# Patient Record
Sex: Female | Born: 1985
Health system: Southern US, Community
[De-identification: ages and names within clinical notes are randomized; demographics above are authoritative.]

## PROBLEM LIST (undated history)

## (undated) DIAGNOSIS — Z789 Other specified health status: Secondary | ICD-10-CM

## (undated) DIAGNOSIS — G43909 Migraine, unspecified, not intractable, without status migrainosus: Secondary | ICD-10-CM

## (undated) DIAGNOSIS — K429 Umbilical hernia without obstruction or gangrene: Secondary | ICD-10-CM

## (undated) DIAGNOSIS — R519 Headache, unspecified: Secondary | ICD-10-CM

## (undated) DIAGNOSIS — E66813 Obesity, class 3: Secondary | ICD-10-CM

## (undated) DIAGNOSIS — D649 Anemia, unspecified: Secondary | ICD-10-CM

## (undated) DIAGNOSIS — Z6841 Body Mass Index (BMI) 40.0 and over, adult: Secondary | ICD-10-CM

## (undated) DIAGNOSIS — Z0000 Encounter for general adult medical examination without abnormal findings: Secondary | ICD-10-CM

---

## 2016-02-02 ENCOUNTER — Other Ambulatory Visit (HOSPITAL_COMMUNITY)
Admission: RE | Admit: 2016-02-02 | Discharge: 2016-02-02 | Disposition: A | Discharge status: Home / Self Care | Payer: BLUE CROSS/BLUE SHIELD | Source: Ambulatory Visit | Attending: Nurse Practitioner | Admitting: Nurse Practitioner

## 2016-02-02 DIAGNOSIS — Z1151 Encounter for screening for human papillomavirus (HPV): Secondary | ICD-10-CM | POA: Diagnosis present

## 2016-02-02 DIAGNOSIS — Z01419 Encounter for gynecological examination (general) (routine) without abnormal findings: Secondary | ICD-10-CM | POA: Diagnosis present

## 2016-05-13 LAB — OB RESULTS CONSOLE ABO/RH: RH Type: POSITIVE

## 2016-05-13 LAB — OB RESULTS CONSOLE RUBELLA ANTIBODY, IGM: Rubella: IMMUNE

## 2016-05-13 LAB — OB RESULTS CONSOLE GC/CHLAMYDIA
CHLAMYDIA, DNA PROBE: NEGATIVE
GC PROBE AMP, GENITAL: NEGATIVE

## 2016-05-13 LAB — OB RESULTS CONSOLE RPR: RPR: NONREACTIVE

## 2016-05-13 LAB — OB RESULTS CONSOLE ANTIBODY SCREEN: ANTIBODY SCREEN: NEGATIVE

## 2016-05-13 LAB — OB RESULTS CONSOLE HEPATITIS B SURFACE ANTIGEN: HEP B S AG: NEGATIVE

## 2016-05-13 LAB — OB RESULTS CONSOLE HIV ANTIBODY (ROUTINE TESTING): HIV: NONREACTIVE

## 2016-11-09 ENCOUNTER — Encounter (HOSPITAL_COMMUNITY): Payer: Self-pay | Admitting: *Deleted

## 2016-11-09 ENCOUNTER — Encounter (HOSPITAL_COMMUNITY): Payer: Self-pay

## 2016-11-09 ENCOUNTER — Telehealth (HOSPITAL_COMMUNITY): Payer: Self-pay | Admitting: *Deleted

## 2016-11-09 NOTE — Telephone Encounter (Signed)
Preadmission screen  

## 2016-11-10 ENCOUNTER — Other Ambulatory Visit (HOSPITAL_COMMUNITY): Payer: Self-pay | Admitting: Obstetrics & Gynecology

## 2016-11-14 ENCOUNTER — Ambulatory Visit (INDEPENDENT_AMBULATORY_CARE_PROVIDER_SITE_OTHER): Payer: BLUE CROSS/BLUE SHIELD | Admitting: *Deleted

## 2016-11-14 DIAGNOSIS — O09893 Supervision of other high risk pregnancies, third trimester: Secondary | ICD-10-CM | POA: Insufficient documentation

## 2016-11-14 DIAGNOSIS — O09213 Supervision of pregnancy with history of pre-term labor, third trimester: Secondary | ICD-10-CM

## 2016-11-14 DIAGNOSIS — Z98891 History of uterine scar from previous surgery: Secondary | ICD-10-CM

## 2016-11-14 MED ORDER — BETAMETHASONE SOD PHOS & ACET 6 (3-3) MG/ML IJ SUSP
6.0000 mg | Freq: Every day | INTRAMUSCULAR | Status: AC
  Administered 2016-11-14: 6 mg via INTRAMUSCULAR
  Administered 2016-11-15: 12 mg via INTRAMUSCULAR

## 2016-11-14 MED ORDER — BETAMETHASONE SOD PHOS & ACET 6 (3-3) MG/ML IJ SUSP: 6.0000 mg | Freq: Once | INTRAMUSCULAR | Status: DC

## 2016-11-14 NOTE — H&P (Signed)
HPI2 31 y/o G3P2002 @ 1943w5d estimated gestational age (as dated by LMP c/w 20 week ultrasound) presents for scheduled repeat C-section due to prior classical C-section.   no Leaking of Fluid,   no Vaginal Bleeding,   no Uterine Contractions,  + Fetal Movement.  ROS: no HA, no epigastric pain, no visual changes.    Pregnancy complicated by: 1) Prior classical C-section- old records obtained and confirmed 2) GBS pos 3) Gestational thrombocytopenia: last labs 11/30: plt 142   Prenatal Transfer Tool  Maternal Diabetes: No Genetic Screening: Normal Maternal Ultrasounds/Referrals: Normal Fetal Ultrasounds or other Referrals:  None Maternal Substance Abuse:  No Significant Maternal Medications:  None Significant Maternal Lab Results: Lab values include: Group B Strep positive   PNL:  GBS positive, Rub Immune, Hep B neg, RPR NR, HIV neg, GC/C neg, glucola:130 11/30: Hgb 12, Plt 142 Blood type: A positive, antibody neg  Immunizations: Tdap: 12/21 Flu: Rc'd as outpatient  OBHx: C-section x 2, prior vertical incision and classical C-section (9#6-9#13oz) PMHx:  none Meds:  PNV Allergy:  No Known Allergies SurgHx: C-section x 2 SocHx:   no Tobacco, no  EtOH, no Illicit Drugs  O: LMP 03/05/2016  (performed in office) Gen. AAOx3, NAD CV.  RRR  No murmur.  Resp. CTAB Abd. Gravid,  no tenderness,  no rigidity,  no guarding Extr.  no edema B/L , no calf tenderness  FHT:145 by doppler  Labs: see orders  A/P:  31 y.o. G3P2002 @ 6243w5d EGA who presents for scheduled early C-section due to prior Classical C-seciton -FWB:  Reassuring by doppler -NPO -LR @ 125cc/hr -SCDs to OR -Ancef 2g IV -Risk benefits and alternatives of cesarean section were discussed with the patient including but not limited to infection, bleeding, damage to bowel , bladder and baby with the need for further surgery. Pt voiced understanding and desires to proceed.   Myna HidalgoJennifer Junell Cullifer, DO [phone]  (pager) [phone] (office)

## 2016-11-15 ENCOUNTER — Ambulatory Visit (INDEPENDENT_AMBULATORY_CARE_PROVIDER_SITE_OTHER): Payer: BLUE CROSS/BLUE SHIELD | Admitting: *Deleted

## 2016-11-15 ENCOUNTER — Encounter: Payer: Self-pay | Admitting: *Deleted

## 2016-11-15 DIAGNOSIS — O09213 Supervision of pregnancy with history of pre-term labor, third trimester: Secondary | ICD-10-CM

## 2016-11-15 DIAGNOSIS — O09893 Supervision of other high risk pregnancies, third trimester: Secondary | ICD-10-CM

## 2016-11-15 NOTE — Progress Notes (Signed)
Pt arrived for 2nd dose of Betamethasone 12 mg IM - given and tolerated well.

## 2016-11-16 ENCOUNTER — Encounter (HOSPITAL_COMMUNITY)
Admission: RE | Admit: 2016-11-16 | Discharge: 2016-11-16 | Disposition: A | Discharge status: Home / Self Care | Payer: BLUE CROSS/BLUE SHIELD | Source: Ambulatory Visit | Attending: Obstetrics & Gynecology | Admitting: Obstetrics & Gynecology

## 2016-11-16 HISTORY — DX: Other specified health status: Z78.9

## 2016-11-16 LAB — CBC
HEMATOCRIT: 33.1 % — AB (ref 36.0–46.0)
HEMOGLOBIN: 11.5 g/dL — AB (ref 12.0–15.0)
MCH: 31.3 pg (ref 26.0–34.0)
MCHC: 34.7 g/dL (ref 30.0–36.0)
MCV: 90.2 fL (ref 78.0–100.0)
Platelets: 126 10*3/uL — ABNORMAL LOW (ref 150–400)
RBC: 3.67 MIL/uL — AB (ref 3.87–5.11)
RDW: 13.6 % (ref 11.5–15.5)
WBC: 8.3 10*3/uL (ref 4.0–10.5)

## 2016-11-16 LAB — ABO/RH: ABO/RH(D): A POS

## 2016-11-16 NOTE — Patient Instructions (Signed)
19 Leslie [name]  11/16/2016   Your procedure is scheduled on:  11/17/2016  [facility].  Pick up the phone at the desk and dial [phone].   Call this number if you have problems the morning of surgery: [phone]   Remember:   Do not eat food:After Midnight.  Do not drink clear liquids: After Midnight.  Take these medicines the morning of surgery with A SIP OF WATER: none   Do not wear jewelry, make-up or nail polish.  Do not wear lotions, powders, or perfumes. Do not wear deodorant.  Do not shave 48 hours prior to surgery.  Do not bring valuables to the hospital.  [facility] is not   responsible for any belongings or valuables brought to the hospital.  Contacts, dentures or bridgework may not be worn into surgery.  Leave suitcase in the car. After surgery it may be brought to your room.  For patients admitted to the hospital, checkout time is 11:00 AM the day of              discharge.   Patients discharged the day of surgery will not be allowed to drive             home.  Name and phone number of your driver: na  Special Instructions:   N/A   Please read over the following fact sheets that you were given:   Surgical Site Infection Prevention

## 2016-11-16 NOTE — Pre-Procedure Instructions (Signed)
Notified Dr Montel CulverKerrigan of platelet preop count no orders rec to repeat the lab in the morning.

## 2016-11-17 ENCOUNTER — Inpatient Hospital Stay (HOSPITAL_COMMUNITY): Payer: BLUE CROSS/BLUE SHIELD | Admitting: Certified Registered Nurse Anesthetist

## 2016-11-17 ENCOUNTER — Inpatient Hospital Stay (HOSPITAL_COMMUNITY)
Admission: RE | Admit: 2016-11-17 | Discharge: 2016-11-20 | DRG: 765 | Disposition: A | Discharge status: Home / Self Care | Payer: BLUE CROSS/BLUE SHIELD | Source: Ambulatory Visit | Attending: Obstetrics & Gynecology | Admitting: Obstetrics & Gynecology

## 2016-11-17 ENCOUNTER — Encounter (HOSPITAL_COMMUNITY)
Admission: RE | Disposition: A | Discharge status: Home / Self Care | Payer: Self-pay | Source: Ambulatory Visit | Attending: Obstetrics & Gynecology

## 2016-11-17 ENCOUNTER — Encounter (HOSPITAL_COMMUNITY): Payer: Self-pay

## 2016-11-17 DIAGNOSIS — O34212 Maternal care for vertical scar from previous cesarean delivery: Secondary | ICD-10-CM | POA: Diagnosis present

## 2016-11-17 DIAGNOSIS — Z3A36 36 weeks gestation of pregnancy: Secondary | ICD-10-CM

## 2016-11-17 DIAGNOSIS — O99824 Streptococcus B carrier state complicating childbirth: Secondary | ICD-10-CM | POA: Diagnosis not present

## 2016-11-17 DIAGNOSIS — D6959 Other secondary thrombocytopenia: Secondary | ICD-10-CM | POA: Diagnosis present

## 2016-11-17 DIAGNOSIS — Z302 Encounter for sterilization: Secondary | ICD-10-CM | POA: Diagnosis not present

## 2016-11-17 DIAGNOSIS — Z98891 History of uterine scar from previous surgery: Secondary | ICD-10-CM

## 2016-11-17 DIAGNOSIS — O99112 Other diseases of the blood and blood-forming organs and certain disorders involving the immune mechanism complicating pregnancy, second trimester: Secondary | ICD-10-CM | POA: Diagnosis not present

## 2016-11-17 DIAGNOSIS — O321XX0 Maternal care for breech presentation, not applicable or unspecified: Secondary | ICD-10-CM | POA: Diagnosis present

## 2016-11-17 DIAGNOSIS — D696 Thrombocytopenia, unspecified: Secondary | ICD-10-CM | POA: Diagnosis not present

## 2016-11-17 DIAGNOSIS — Z3493 Encounter for supervision of normal pregnancy, unspecified, third trimester: Secondary | ICD-10-CM

## 2016-11-17 DIAGNOSIS — O9912 Other diseases of the blood and blood-forming organs and certain disorders involving the immune mechanism complicating childbirth: Secondary | ICD-10-CM | POA: Diagnosis present

## 2016-11-17 DIAGNOSIS — O34219 Maternal care for unspecified type scar from previous cesarean delivery: Secondary | ICD-10-CM | POA: Diagnosis not present

## 2016-11-17 DIAGNOSIS — O09213 Supervision of pregnancy with history of pre-term labor, third trimester: Secondary | ICD-10-CM | POA: Diagnosis not present

## 2016-11-17 LAB — RPR: RPR: NONREACTIVE

## 2016-11-17 LAB — PREPARE RBC (CROSSMATCH)

## 2016-11-17 SURGERY — Surgical Case
Anesthesia: Spinal

## 2016-11-17 MED ORDER — ONDANSETRON HCL 4 MG/2ML IJ SOLN: 4.0000 mg | Freq: Four times a day (QID) | INTRAMUSCULAR | Status: DC | PRN

## 2016-11-17 MED ORDER — NALBUPHINE HCL 10 MG/ML IJ SOLN: 5.0000 mg | INTRAMUSCULAR | Status: DC | PRN

## 2016-11-17 MED ORDER — LACTATED RINGERS IV SOLN
INTRAVENOUS | Status: DC | PRN
  Administered 2016-11-17: 08:00:00 via INTRAVENOUS

## 2016-11-17 MED ORDER — SIMETHICONE 80 MG PO CHEW
80.0000 mg | CHEWABLE_TABLET | ORAL | Status: DC
  Administered 2016-11-17 – 2016-11-19 (×3): 80 mg via ORAL
  Filled 2016-11-17 (×3): qty 1

## 2016-11-17 MED ORDER — OXYTOCIN 10 UNIT/ML IJ SOLN
INTRAMUSCULAR | Status: AC
  Filled 2016-11-17: qty 4

## 2016-11-17 MED ORDER — NALOXONE HCL 0.4 MG/ML IJ SOLN: 0.4000 mg | INTRAMUSCULAR | Status: DC | PRN

## 2016-11-17 MED ORDER — IBUPROFEN 600 MG PO TABS
600.0000 mg | ORAL_TABLET | Freq: Four times a day (QID) | ORAL | Status: DC
  Administered 2016-11-17 – 2016-11-20 (×12): 600 mg via ORAL
  Filled 2016-11-17 (×12): qty 1

## 2016-11-17 MED ORDER — CEFAZOLIN SODIUM-DEXTROSE 2-4 GM/100ML-% IV SOLN
2.0000 g | INTRAVENOUS | Status: AC
  Administered 2016-11-17: 2 g via INTRAVENOUS
  Filled 2016-11-17: qty 100

## 2016-11-17 MED ORDER — KETOROLAC TROMETHAMINE 30 MG/ML IJ SOLN: 30.0000 mg | Freq: Four times a day (QID) | INTRAMUSCULAR | Status: DC | PRN

## 2016-11-17 MED ORDER — PHENYLEPHRINE 8 MG IN D5W 100 ML (0.08MG/ML) PREMIX OPTIME
INJECTION | INTRAVENOUS | Status: DC | PRN
  Administered 2016-11-17: 60 ug/min via INTRAVENOUS

## 2016-11-17 MED ORDER — PROMETHAZINE HCL 25 MG/ML IJ SOLN
INTRAMUSCULAR | Status: AC
  Filled 2016-11-17: qty 1

## 2016-11-17 MED ORDER — PHENYLEPHRINE 8 MG IN D5W 100 ML (0.08MG/ML) PREMIX OPTIME
INJECTION | INTRAVENOUS | Status: AC
  Filled 2016-11-17: qty 100

## 2016-11-17 MED ORDER — BUPIVACAINE IN DEXTROSE 0.75-8.25 % IT SOLN
INTRATHECAL | Status: DC | PRN
  Administered 2016-11-17: 1.6 mL via INTRATHECAL

## 2016-11-17 MED ORDER — FENTANYL CITRATE (PF) 100 MCG/2ML IJ SOLN
INTRAMUSCULAR | Status: AC
  Filled 2016-11-17: qty 2

## 2016-11-17 MED ORDER — DEXAMETHASONE SODIUM PHOSPHATE 10 MG/ML IJ SOLN
INTRAMUSCULAR | Status: DC | PRN
  Administered 2016-11-17: 10 mg via INTRAVENOUS

## 2016-11-17 MED ORDER — WITCH HAZEL-GLYCERIN EX PADS: 1.0000 "application " | MEDICATED_PAD | CUTANEOUS | Status: DC | PRN

## 2016-11-17 MED ORDER — FENTANYL CITRATE (PF) 100 MCG/2ML IJ SOLN
INTRAMUSCULAR | Status: DC | PRN
  Administered 2016-11-17: 10 ug via INTRATHECAL

## 2016-11-17 MED ORDER — MENTHOL 3 MG MT LOZG: 1.0000 | LOZENGE | OROMUCOSAL | Status: DC | PRN

## 2016-11-17 MED ORDER — SENNOSIDES-DOCUSATE SODIUM 8.6-50 MG PO TABS
2.0000 | ORAL_TABLET | ORAL | Status: DC
  Administered 2016-11-17 – 2016-11-19 (×3): 2 via ORAL
  Filled 2016-11-17 (×3): qty 2

## 2016-11-17 MED ORDER — DIPHENHYDRAMINE HCL 50 MG/ML IJ SOLN: 12.5000 mg | INTRAMUSCULAR | Status: DC | PRN

## 2016-11-17 MED ORDER — SCOPOLAMINE 1 MG/3DAYS TD PT72
MEDICATED_PATCH | TRANSDERMAL | Status: AC
  Filled 2016-11-17: qty 1

## 2016-11-17 MED ORDER — NALOXONE HCL 2 MG/2ML IJ SOSY
1.0000 ug/kg/h | PREFILLED_SYRINGE | INTRAVENOUS | Status: DC | PRN
  Filled 2016-11-17: qty 2

## 2016-11-17 MED ORDER — DIPHENHYDRAMINE HCL 25 MG PO CAPS
25.0000 mg | ORAL_CAPSULE | ORAL | Status: DC | PRN
  Filled 2016-11-17: qty 1

## 2016-11-17 MED ORDER — NALBUPHINE HCL 10 MG/ML IJ SOLN: 5.0000 mg | Freq: Once | INTRAMUSCULAR | Status: DC | PRN

## 2016-11-17 MED ORDER — PRENATAL MULTIVITAMIN CH
1.0000 | ORAL_TABLET | Freq: Every day | ORAL | Status: DC
  Administered 2016-11-17 – 2016-11-19 (×3): 1 via ORAL
  Filled 2016-11-17 (×3): qty 1

## 2016-11-17 MED ORDER — MEPERIDINE HCL 25 MG/ML IJ SOLN: 6.2500 mg | INTRAMUSCULAR | Status: DC | PRN

## 2016-11-17 MED ORDER — SCOPOLAMINE 1 MG/3DAYS TD PT72
MEDICATED_PATCH | TRANSDERMAL | Status: DC | PRN
  Administered 2016-11-17: 1 via TRANSDERMAL

## 2016-11-17 MED ORDER — ONDANSETRON HCL 4 MG/2ML IJ SOLN: 4.0000 mg | Freq: Three times a day (TID) | INTRAMUSCULAR | Status: DC | PRN

## 2016-11-17 MED ORDER — OXYCODONE HCL 5 MG PO TABS: 5.0000 mg | ORAL_TABLET | Freq: Once | ORAL | Status: DC | PRN

## 2016-11-17 MED ORDER — PROMETHAZINE HCL 25 MG/ML IJ SOLN
12.5000 mg | Freq: Once | INTRAMUSCULAR | Status: AC
  Administered 2016-11-17: 12.5 mg via INTRAVENOUS

## 2016-11-17 MED ORDER — SCOPOLAMINE 1 MG/3DAYS TD PT72: 1.0000 | MEDICATED_PATCH | Freq: Once | TRANSDERMAL | Status: DC

## 2016-11-17 MED ORDER — COCONUT OIL OIL: 1.0000 "application " | TOPICAL_OIL | Status: DC | PRN

## 2016-11-17 MED ORDER — DIBUCAINE 1 % RE OINT: 1.0000 "application " | TOPICAL_OINTMENT | RECTAL | Status: DC | PRN

## 2016-11-17 MED ORDER — METOCLOPRAMIDE HCL 5 MG/ML IJ SOLN
INTRAMUSCULAR | Status: AC
  Filled 2016-11-17: qty 2

## 2016-11-17 MED ORDER — OXYTOCIN 40 UNITS IN LACTATED RINGERS INFUSION - SIMPLE MED: 2.5000 [IU]/h | INTRAVENOUS | Status: AC

## 2016-11-17 MED ORDER — LACTATED RINGERS IV SOLN
INTRAVENOUS | Status: DC
  Administered 2016-11-17: 11:00:00 via INTRAVENOUS

## 2016-11-17 MED ORDER — MORPHINE SULFATE (PF) 0.5 MG/ML IJ SOLN
INTRAMUSCULAR | Status: DC | PRN
  Administered 2016-11-17: .2 mg via INTRATHECAL

## 2016-11-17 MED ORDER — SODIUM CHLORIDE 0.9 % IR SOLN
Status: DC | PRN
  Administered 2016-11-17: 1

## 2016-11-17 MED ORDER — ZOLPIDEM TARTRATE 5 MG PO TABS: 5.0000 mg | ORAL_TABLET | Freq: Every evening | ORAL | Status: DC | PRN

## 2016-11-17 MED ORDER — ACETAMINOPHEN 325 MG PO TABS: 650.0000 mg | ORAL_TABLET | ORAL | Status: DC | PRN

## 2016-11-17 MED ORDER — MORPHINE SULFATE (PF) 0.5 MG/ML IJ SOLN
INTRAMUSCULAR | Status: AC
  Filled 2016-11-17: qty 10

## 2016-11-17 MED ORDER — SODIUM CHLORIDE 0.9% FLUSH: 3.0000 mL | INTRAVENOUS | Status: DC | PRN

## 2016-11-17 MED ORDER — OXYTOCIN 10 UNIT/ML IJ SOLN
INTRAVENOUS | Status: DC | PRN
  Administered 2016-11-17: 40 [IU] via INTRAVENOUS

## 2016-11-17 MED ORDER — LACTATED RINGERS IV SOLN
INTRAVENOUS | Status: DC
  Administered 2016-11-17 (×2): via INTRAVENOUS

## 2016-11-17 MED ORDER — METOCLOPRAMIDE HCL 5 MG/ML IJ SOLN
INTRAMUSCULAR | Status: DC | PRN
  Administered 2016-11-17: 10 mg via INTRAVENOUS

## 2016-11-17 MED ORDER — DIPHENHYDRAMINE HCL 25 MG PO CAPS: 25.0000 mg | ORAL_CAPSULE | Freq: Four times a day (QID) | ORAL | Status: DC | PRN

## 2016-11-17 MED ORDER — OXYCODONE HCL 5 MG PO TABS
5.0000 mg | ORAL_TABLET | ORAL | Status: DC | PRN
  Administered 2016-11-18: 5 mg via ORAL
  Filled 2016-11-17: qty 1

## 2016-11-17 MED ORDER — OXYCODONE HCL 5 MG/5ML PO SOLN: 5.0000 mg | Freq: Once | ORAL | Status: DC | PRN

## 2016-11-17 MED ORDER — SIMETHICONE 80 MG PO CHEW: 80.0000 mg | CHEWABLE_TABLET | ORAL | Status: DC | PRN

## 2016-11-17 MED ORDER — DEXAMETHASONE SODIUM PHOSPHATE 10 MG/ML IJ SOLN
INTRAMUSCULAR | Status: AC
  Filled 2016-11-17: qty 1

## 2016-11-17 MED ORDER — FENTANYL CITRATE (PF) 100 MCG/2ML IJ SOLN: 25.0000 ug | INTRAMUSCULAR | Status: DC | PRN

## 2016-11-17 MED ORDER — ONDANSETRON HCL 4 MG/2ML IJ SOLN
INTRAMUSCULAR | Status: DC | PRN
  Administered 2016-11-17: 4 mg via INTRAVENOUS

## 2016-11-17 MED ORDER — SIMETHICONE 80 MG PO CHEW
80.0000 mg | CHEWABLE_TABLET | Freq: Three times a day (TID) | ORAL | Status: DC
  Administered 2016-11-17 – 2016-11-19 (×7): 80 mg via ORAL
  Filled 2016-11-17 (×7): qty 1

## 2016-11-17 MED ORDER — ONDANSETRON HCL 4 MG/2ML IJ SOLN
INTRAMUSCULAR | Status: AC
  Filled 2016-11-17: qty 2

## 2016-11-17 SURGICAL SUPPLY — 39 items
BENZOIN TINCTURE PRP APPL 2/3 (GAUZE/BANDAGES/DRESSINGS) ×3 IMPLANT
CLAMP CORD UMBIL (MISCELLANEOUS) IMPLANT
CLOSURE STERI STRIP 1/2 X4 (GAUZE/BANDAGES/DRESSINGS) ×2 IMPLANT
CLOSURE WOUND 1/2 X4 (GAUZE/BANDAGES/DRESSINGS) ×1
CLOTH BEACON ORANGE TIMEOUT ST (SAFETY) ×3 IMPLANT
DERMABOND ADVANCED (GAUZE/BANDAGES/DRESSINGS)
DERMABOND ADVANCED .7 DNX12 (GAUZE/BANDAGES/DRESSINGS) IMPLANT
DRSG OPSITE POSTOP 4X10 (GAUZE/BANDAGES/DRESSINGS) ×3 IMPLANT
DURAPREP 26ML APPLICATOR (WOUND CARE) ×3 IMPLANT
ELECT REM PT RETURN 9FT ADLT (ELECTROSURGICAL) ×3
ELECTRODE REM PT RTRN 9FT ADLT (ELECTROSURGICAL) ×1 IMPLANT
EXTRACTOR VACUUM KIWI (MISCELLANEOUS) IMPLANT
GLOVE BIOGEL PI IND STRL 6.5 (GLOVE) ×1 IMPLANT
GLOVE BIOGEL PI IND STRL 7.0 (GLOVE) ×1 IMPLANT
GLOVE BIOGEL PI INDICATOR 6.5 (GLOVE) ×2
GLOVE BIOGEL PI INDICATOR 7.0 (GLOVE) ×2
GLOVE ECLIPSE 6.5 STRL STRAW (GLOVE) ×3 IMPLANT
GOWN STRL REUS W/TWL LRG LVL3 (GOWN DISPOSABLE) ×6 IMPLANT
HEMOSTAT SURGICEL 4X8 (HEMOSTASIS) ×3 IMPLANT
KIT ABG SYR 3ML LUER SLIP (SYRINGE) ×3 IMPLANT
NEEDLE HYPO 25X5/8 SAFETYGLIDE (NEEDLE) ×3 IMPLANT
NS IRRIG 1000ML POUR BTL (IV SOLUTION) ×3 IMPLANT
PACK C SECTION WH (CUSTOM PROCEDURE TRAY) ×3 IMPLANT
PAD ABD 7.5X8 STRL (GAUZE/BANDAGES/DRESSINGS) ×3 IMPLANT
PAD OB MATERNITY 4.3X12.25 (PERSONAL CARE ITEMS) ×3 IMPLANT
PENCIL SMOKE EVAC W/HOLSTER (ELECTROSURGICAL) ×3 IMPLANT
RTRCTR C-SECT PINK 25CM LRG (MISCELLANEOUS) ×3 IMPLANT
STRIP CLOSURE SKIN 1/2X4 (GAUZE/BANDAGES/DRESSINGS) ×2 IMPLANT
SUT PLAIN 0 NONE (SUTURE) ×6 IMPLANT
SUT PLAIN 2 0 XLH (SUTURE) IMPLANT
SUT VIC AB 0 CT1 27 (SUTURE) ×4
SUT VIC AB 0 CT1 27XBRD ANBCTR (SUTURE) ×2 IMPLANT
SUT VIC AB 0 CTX 36 (SUTURE) ×6
SUT VIC AB 0 CTX36XBRD ANBCTRL (SUTURE) ×3 IMPLANT
SUT VIC AB 2-0 CT1 27 (SUTURE) ×2
SUT VIC AB 2-0 CT1 TAPERPNT 27 (SUTURE) ×1 IMPLANT
SUT VIC AB 4-0 KS 27 (SUTURE) ×3 IMPLANT
TOWEL OR 17X24 6PK STRL BLUE (TOWEL DISPOSABLE) ×3 IMPLANT
TRAY FOLEY CATH SILVER 14FR (SET/KITS/TRAYS/PACK) IMPLANT

## 2016-11-17 NOTE — Anesthesia Preprocedure Evaluation (Signed)
Anesthesia Evaluation  Patient identified by MRN, date of birth, ID band Patient awake    Reviewed: Allergy & Precautions, H&P , NPO status , Patient's Chart, lab work & pertinent test results  Airway Mallampati: II   Neck ROM: full    Dental   Pulmonary neg pulmonary ROS,    breath sounds clear to auscultation       Cardiovascular negative cardio ROS   Rhythm:regular Rate:Normal     Neuro/Psych    GI/Hepatic   Endo/Other  obese  Renal/GU      Musculoskeletal   Abdominal   Peds  Hematology   Anesthesia Other Findings   Reproductive/Obstetrics IUP.                             Anesthesia Physical Anesthesia Plan  ASA: II  Anesthesia Plan: Spinal   Post-op Pain Management:    Induction: Intravenous  Airway Management Planned: Simple Face Mask  Additional Equipment:   Intra-op Plan:   Post-operative Plan:   Informed Consent: I have reviewed the patients History and Physical, chart, labs and discussed the procedure including the risks, benefits and alternatives for the proposed anesthesia with the patient or authorized representative who has indicated his/her understanding and acceptance.     Plan Discussed with: CRNA, Anesthesiologist and Surgeon  Anesthesia Plan Comments:         Anesthesia Quick Evaluation

## 2016-11-17 NOTE — Anesthesia Procedure Notes (Signed)
Spinal  Patient location during procedure: OR Start time: 11/17/2016 7:29 AM End time: 11/17/2016 7:39 AM Staffing Anesthesiologist: Heather RobertsSINGER, Arlayne Liggins Performed: anesthesiologist  Preanesthetic Checklist Completed: patient identified, surgical consent, pre-op evaluation, timeout performed, IV checked, risks and benefits discussed and monitors and equipment checked Spinal Block Patient position: sitting Prep: DuraPrep Patient monitoring: cardiac monitor, continuous pulse ox and blood pressure Approach: midline Location: L2-3 Injection technique: single-shot Needle Needle type: Pencan  Needle gauge: 24 G Needle length: 9 cm Additional Notes Functioning IV was confirmed and monitors were applied. Sterile prep and drape, including hand hygiene and sterile gloves were used. The patient was positioned and the spine was prepped. The skin was anesthetized with lidocaine.  Free flow of clear CSF was obtained prior to injecting local anesthetic into the CSF.  The spinal needle aspirated freely following injection.  The needle was carefully withdrawn.  The patient tolerated the procedure well.

## 2016-11-17 NOTE — Lactation Note (Signed)
This note was copied from a baby's chart. Lactation Consultation Note  Patient Name: Leslie [name] [id number]'s Date: 11/17/2016 Reason for consult: Initial assessment;NICU baby;Late preterm infant Breastfeeding consultation services and Providing Breastmilk for Your Baby in NICU booklet given and reviewed.  Baby is 36.5 weeks and in NICU.  Mom has initiated pumping and obtained 30 mls.  Reviewed importance of pumping 8-12 times in 24 hours followed by hand expression.  Discussed late preterm feeding norm.  Mom states she has a DEBP at home.  Encouraged to call with questions/concerns.  Maternal Data Has patient been taught Hand Expression?: Yes Does the patient have breastfeeding experience prior to this delivery?: Yes  Feeding    LATCH Score/Interventions                      Lactation Tools Discussed/Used Pump Review: Setup, frequency, and cleaning;Milk Storage Initiated by:: RN Date initiated:: 11/17/16   Consult Status Consult Status: Follow-up Date: 11/18/16 Follow-up type: In-patient    Huston FoleyMOULDEN, Wenzel Backlund S 11/17/2016, 3:47 PM

## 2016-11-17 NOTE — Op Note (Signed)
PreOp Diagnosis:  1) Intrauterine pregnancy @ 347w5d  2) Prior Classical C-section 3) Desire for permanent sterilization  PostOp Diagnosis: same Procedure: Repeat low transverse C-section and bilateral salpingectomy Surgeon: Dr. Myna HidalgoJennifer Loretta [name] Assistant: Dr. Marylou FlesherBenita Varnado Anesthesia: spinal Complications: none EBL: 600cc UOP: 2000cc Fluids: 150cc  Findings: Female infant from breech presentation, normal tubes and ovaries bilaterally.  PROCEDURE:  Informed consent was obtained from the patient with risks, benefits, complications, treatment options, and expected outcomes discussed with the patient.  The patient concurred with the proposed plan, giving informed consent with form signed.   The patient was taken to Operating Room, and identified with the procedure verified as C-Section Delivery with Time Out. Traxi was used for traction.  With induction of anesthesia, the patient was prepped and draped in the usual sterile fashion. A Pfannenstiel incision was made and carried down through the subcutaneous tissue to the fascia. The fascia was incised in the midline and extended transversely. The superior aspect of the fascial incision was grasped with Kochers elevated and the underlying muscle dissected off. The inferior aspect of the facial incision was in similar fashion, grasped elevated and rectus muscles dissected off. Due to prior scar tissue, the peritoneum was entered with dissection of the fascia.  The utero-vesical peritoneal reflection was identified and due to prior scar tissue and thin uterus, bladder flap was not created.  A low transverse uterine incision was made and the infant's sacrum delivered atraumatically. The legs were flexed and delivered.  The arms were rotated and delivered followed by flexion of the head with delivery.  After the umbilical cord was clamped and cut cord blood was obtained for evaluation.   The placenta was removed intact and appeared normal. The uterine  outline, tubes and ovaries appeared normal. The uterine incision was closed with running locked sutures of 0 Vicryl and a second layer of the same stitch was used in an imbricating fashion.  Attention was turned to the left fallopian tube, fimbraie were identified. Making an avascular window, serial ligation of the tube was performed using plain gut for removal of the lower 2/3 of the fallopian tube.  A similar procedure was carried out on the right.  Excellent hemostasis was obtained.  The pericolic gutters were cleared of all clots and debris.  Surgicel was placed over the hysterotomy.  The fascia was then reapproximated with running sutures of 0 Vicryl. The subcutaneous tissue was reapproximated with 2-0 plain gut suture.   The skin was closed with 4-0 vicryl in a subcuticular fashion.  Instrument, sponge, and needle counts were correct prior the abdominal closure and at the conclusion of the case. The patient was taken to recovery in stable condition.  Myna HidalgoJennifer Zhion Pevehouse, DO [phone] (pager) [phone] (office)

## 2016-11-17 NOTE — Interval H&P Note (Signed)
History and Physical Interval Note:  11/17/2016 6:33 AM  Leslie [name]  has presented today for surgery, with the diagnosis of Z98.891 History of C-Section  The various methods of treatment have been discussed with the patient and family. After consideration of risks, benefits and other options for treatment, the patient has consented to  Procedure(s): CESAREAN SECTION WITH BILATERAL TUBAL LIGATION (N/A) possible bilateral salpingectomy as a surgical intervention .  The patient's history has been reviewed, patient examined, no change in status, stable for surgery.  I have reviewed the patient's chart and labs.  Questions were answered to the patient's satisfaction.     Myna HidalgoZAN, Vardaan Depascale, M

## 2016-11-17 NOTE — Anesthesia Postprocedure Evaluation (Addendum)
Anesthesia Post Note  Patient: Leslie [name]  Procedure(s) Performed: Procedure(s) (LRB): CESAREAN SECTION WITH BILATERAL TUBAL LIGATION (N/A)  Patient location during evaluation: Mother Baby Anesthesia Type: Spinal Level of consciousness: awake and alert and oriented Pain management: satisfactory to patient Vital Signs Assessment: post-procedure vital signs reviewed and stable Respiratory status: spontaneous breathing and nonlabored ventilation Cardiovascular status: stable Postop Assessment: no headache, no backache, patient able to bend at knees, no signs of nausea or vomiting and adequate PO intake Anesthetic complications: no        Last Vitals:  Vitals:   11/17/16 1255 11/17/16 1400  BP: 121/66 132/67  Pulse: 64 79  Resp:    Temp: 36.7 C 36.9 C    Last Pain:  Vitals:   11/17/16 1600  TempSrc:   PainSc: 0-No pain   Pain Goal:                 GREGORY,SUZANNE

## 2016-11-17 NOTE — Addendum Note (Signed)
Addendum  created 11/17/16 1624 by Shanon PayorSuzanne M Daishaun Ayre, CRNA   Sign clinical note

## 2016-11-17 NOTE — Progress Notes (Signed)
Pt OOB and ambulating without difficulty. Foley d/c'd per order. Patient denies having any pain. OB assessment WNL with scant amount of bleeding. Carmelina DaneERRI L Tahnee Cifuentes, RN

## 2016-11-17 NOTE — Transfer of Care (Signed)
Immediate Anesthesia Transfer of Care Note  Patient: Leslie [name]  Procedure(s) Performed: Procedure(s): CESAREAN SECTION WITH BILATERAL TUBAL LIGATION (N/A)  Patient Location: PACU  Anesthesia Type:Spinal  Level of Consciousness: awake, alert , oriented and patient cooperative  Airway & Oxygen Therapy: Patient Spontanous Breathing  Post-op Assessment: Report given to RN and Post -op Vital signs reviewed and stable  Post vital signs: Reviewed and stable  Last Vitals:  Vitals:   11/17/16 0605  BP: 120/75  Pulse: 68  Resp: 18  Temp: 37 C    Last Pain:  Vitals:   11/17/16 0605  TempSrc: Oral         Complications: No apparent anesthesia complications

## 2016-11-17 NOTE — Anesthesia Postprocedure Evaluation (Signed)
Anesthesia Post Note  Patient: Leslie Sierrasatreka [name]  Procedure(s) Performed: Procedure(s) (LRB): CESAREAN SECTION WITH BILATERAL TUBAL LIGATION (N/A)  Patient location during evaluation: PACU Anesthesia Type: Spinal Level of consciousness: awake and alert Pain management: pain level controlled Vital Signs Assessment: post-procedure vital signs reviewed and stable Respiratory status: spontaneous breathing and respiratory function stable Cardiovascular status: blood pressure returned to baseline and stable Postop Assessment: spinal receding Anesthetic complications: no        Last Vitals:  Vitals:   11/17/16 0914 11/17/16 0915  BP:  120/87  Pulse: 73 75  Resp: (!) 32 (!) 26  Temp:      Last Pain:  Vitals:   11/17/16 0605  TempSrc: Oral   Pain Goal:                 [name]

## 2016-11-18 ENCOUNTER — Encounter (HOSPITAL_COMMUNITY): Payer: Self-pay | Admitting: *Deleted

## 2016-11-18 DIAGNOSIS — Z98891 History of uterine scar from previous surgery: Secondary | ICD-10-CM

## 2016-11-18 LAB — CBC
HCT: 32.7 % — ABNORMAL LOW (ref 36.0–46.0)
Hemoglobin: 11.3 g/dL — ABNORMAL LOW (ref 12.0–15.0)
MCH: 30.8 pg (ref 26.0–34.0)
MCHC: 34.6 g/dL (ref 30.0–36.0)
MCV: 89.1 fL (ref 78.0–100.0)
Platelets: 139 10*3/uL — ABNORMAL LOW (ref 150–400)
RBC: 3.67 MIL/uL — ABNORMAL LOW (ref 3.87–5.11)
RDW: 13.7 % (ref 11.5–15.5)
WBC: 10.4 10*3/uL (ref 4.0–10.5)

## 2016-11-18 MED ORDER — DOCUSATE SODIUM 100 MG PO CAPS: 100.0000 mg | ORAL_CAPSULE | Freq: Two times a day (BID) | ORAL | 0 refills | Status: DC

## 2016-11-18 MED ORDER — IBUPROFEN 600 MG PO TABS: 600.0000 mg | ORAL_TABLET | Freq: Four times a day (QID) | ORAL | 0 refills | Status: DC

## 2016-11-18 NOTE — Lactation Note (Signed)
This note was copied from a baby's chart. Lactation Consultation Note  Patient Name: Leslie [name] [id number] Date: 11/18/2016 Reason for consult: Follow-up assessment;NICU baby  Baby 2 hours old. Mom reports that she is getting lots of milk when she pumps. Enc mom to continue pumping every 2-3 hours for a total of at least 8 times/24 hours--followed by hand expression. Enc mom to call insurance company about getting a DEBP, and mom stated that she had already purchased a personal pump. Discussed the benefits of a hospital-grade pump, and mom aware of 2-week rental. Discussed pumping rooms in NICU, and enc taking kit home including paddles.   Maternal Data    Feeding    LATCH Score/Interventions                      Lactation Tools Discussed/Used     Consult Status Consult Status: Follow-up Date: 11/19/16 Follow-up type: In-patient    Andres Labrum 11/18/2016, 11:18 AM

## 2016-11-18 NOTE — Discharge Instructions (Signed)
Cesarean Delivery, Care After Refer to this sheet in the next few weeks. These instructions provide you with information about caring for yourself after your procedure. Your health care provider may also give you more specific instructions. Your treatment has been planned according to current medical practices, but problems sometimes occur. Call your health care provider if you have any problems or questions after your procedure. What can I expect after the procedure? After the procedure, it is common to have:  A small amount of blood or clear fluid coming from the incision.  Some redness, swelling, and pain in your incision area.  Some abdominal pain and soreness.  Vaginal bleeding (lochia).  Pelvic cramps.  Fatigue. Follow these instructions at home: Incision care  Follow instructions from your health care provider about how to take care of your incision. Make sure you:  Wash your hands with soap and water before you change your bandage (dressing). If soap and water are not available, use hand sanitizer.  Change your dressing as told by your health care provider.  Leave stitches (sutures), skin staples, skin glue, or adhesive strips in place. These skin closures may need to stay in place for 2 weeks or longer. If adhesive strip edges start to loosen and curl up, you may trim the loose edges. Do not remove adhesive strips completely unless your health care provider tells you to do that.  Check your incision area every day for signs of infection. Check for:  More redness, swelling, or pain.  More fluid or blood.  Warmth.  Pus or a bad smell.  When you cough or sneeze, hug a pillow. This helps with pain and decreases the chance of your incision opening up (dehiscing). Do this until your incision heals. Medicines  Take over-the-counter and prescription medicines only as told by your health care provider.  For pain management you may alternate between percocet and motrin for pain  management.  Percocet may cause constipation, so please take Colace- stool softener twice daily.  If you were prescribed an antibiotic medicine, take it as told by your health care provider. Do not stop taking the antibiotic until it is finished. Driving  Do not drive or operate heavy machinery while taking prescription pain medicine.  Do not drive for 24 hours if you received a sedative. Lifestyle  Do not drink alcohol. This is especially important if you are breastfeeding or taking pain medicine.  Do not use tobacco products, including cigarettes, chewing tobacco, or e-cigarettes. If you need help quitting, ask your health care provider. Tobacco can delay wound healing. Eating and drinking  Drink at least 8 eight-ounce glasses of water every day unless told not to by your health care provider. If you breastfeed, you may need to drink more water than this.  Eat high-fiber foods every day. These foods may help prevent or relieve constipation. High-fiber foods include:  Whole grain cereals and breads.  Brown rice.  Beans.  Fresh fruits and vegetables. Activity  Return to your normal activities as told by your health care provider. Ask your health care provider what activities are safe for you.  Rest as much as possible. Try to rest or take a nap while your baby is sleeping.  Do not lift anything that is heavier than your baby or 10 lb (4.5 kg) as told by your health care provider.  Ask your health care provider when you can engage in sexual activity. This may depend on your:  Risk of infection.  Healing rate.  Comfort and desire to engage in sexual activity. Bathing  Do not take baths, swim, or use a hot tub until your health care provider approves. Ask your health care provider if you can take showers. You may only be allowed to take sponge baths until your incision heals.  Keep your dressing dry as told by your health care provider. General instructions  Do not use  tampons or douches until your health care provider approves.  Wear:  Loose, comfortable clothing.  A supportive and well-fitting bra.  Watch for any blood clots that may pass from your vagina. These may look like clumps of dark red, brown, or black discharge.  Keep your perineum clean and dry as told by your health care provider.  Wipe from front to back when you use the toilet.  If possible, have someone help you care for your baby and help with household activities for a few days after you leave the hospital.  Keep all follow-up visits for you and your baby as told by your health care provider. This is important. Contact a health care provider if:  You have:  Bad-smelling vaginal discharge.  Difficulty urinating.  Pain when urinating.  A sudden increase or decrease in the frequency of your bowel movements.  More redness, swelling, or pain around your incision.  More fluid or blood coming from your incision.  Pus or a bad smell coming from your incision.  A fever.  A rash.  Little or no interest in activities you used to enjoy.  Questions about caring for yourself or your baby.  Nausea.  Your incision feels warm to the touch.  Your breasts turn red or become painful or hard.  You feel unusually sad or worried.  You vomit.  You pass large blood clots from your vagina. If you pass a blood clot, save it to show to your health care provider. Do not flush blood clots down the toilet without showing your health care provider.  You urinate more than usual.  You are dizzy or light-headed.  You have not breastfed and have not had a menstrual period for 12 weeks after delivery.  You stopped breastfeeding and have not had a menstrual period for 12 weeks after stopping breastfeeding. Get help right away if:  You have:  Pain that does not go away or get better with medicine.  Chest pain.  Difficulty breathing.  Blurred vision or spots in your  vision.  Thoughts about hurting yourself or your baby.  New pain in your abdomen or in one of your legs.  A severe headache.  You faint.  You bleed from your vagina so much that you fill two sanitary pads in one hour. This information is not intended to replace advice given to you by your health care provider. Make sure you discuss any questions you have with your health care provider. Document Released: 06/25/2002 Document Revised: 02/11/2016 Document Reviewed: 09/07/2015 Elsevier Interactive Patient Education  2017 [employer]

## 2016-11-18 NOTE — Addendum Note (Signed)
Addendum  created 11/18/16 [id number] by Algis GreenhouseLinda A Akim Watkinson, CRNA   Charge Capture section accepted

## 2016-11-18 NOTE — Progress Notes (Signed)
Postoperative Note Day # 1  S:  Patient resting comfortable in bed.  Pain controlled.  Tolerating general diet. + flatus, no BM.  Lochia moderate.  Ambulating and voiding without difficulty.  She denies n/v/f/c, SOB, or CP.  Baby in NICU, mom is pumping.  O: Temp:  [97.7 F (36.5 C)-98.4 F (36.9 C)] 98.2 F (36.8 C) (02/02 0453) Pulse Rate:  [59-79] 63 (02/02 0453) Resp:  [8-32] 18 (02/02 0453) BP: (115-132)/(66-87) 115/67 (02/02 0453) SpO2:  [96 %-100 %] 99 % (02/02 0453) Weight:  [230 lb (104.3 kg)] 230 lb (104.3 kg) (02/01 1050)   Gen: A&Ox3, NAD CV: RRR Resp: CTAB Abdomen: soft, NT, ND +BS Uterus: firm, non-tender, below umbilicus Incision: c/d/i, bandage on Ext: No edema, no calf tenderness bilaterally, SCDs in place  Labs:  Recent Labs  11/16/16 0930 11/18/16 0552  HGB 11.5* 11.3*    A/P: Pt is a 31 y.o. [id number] s/p repeat C-section, POD#1  - Pain well controlled -GU: UOP is adequate -GI: Tolerating general diet -Activity: encouraged sitting up to chair and ambulation as tolerated -Prophylaxis: early ambulation, SCDs while in bed -Labs: stable as above  Continue with routine postpartum care  Myna HidalgoJennifer Valdez Brannan, DO [phone] (pager) [phone] (office)

## 2016-11-19 MED ORDER — OXYCODONE HCL 5 MG PO TABS: 5.0000 mg | ORAL_TABLET | Freq: Four times a day (QID) | ORAL | 0 refills | Status: DC | PRN

## 2016-11-19 NOTE — Lactation Note (Signed)
This note was copied from a baby's chart. Lactation Consultation Note  Patient Name: Leslie [name]: 11/19/2016 Reason for consult: Follow-up assessment;NICU baby Infant is 7553 hours old & [facility]. Mom had just finished pumping for ~20 mins when LC entered & mom got ~10 oz. Praised mom for amount she pumped. Mom reports she has been pumping q 3hrs but went ~5hrs this last time because she had visitors with her in the NICU. Mom reports her goal is to pump q 3hrs. Mom reports she feels full before pumping but is comfortable after pumping. Encouraged mom to continue pumping 8-12 x in 24hrs for 15-20 mins. Mom still unsure whether she is going to rent a pump from us; has a personal pump at home. Mom reports no questions at this time. Encouraged mom to ask her nurse if she has any later.  Maternal Data    Feeding    LATCH Score/Interventions                      Lactation Tools Discussed/Used     Consult Status Consult Status: Follow-up [name]: 11/20/16 Follow-up type: In-patient    Oneal GroutLaura C Muriah Harsha 11/19/2016, 1:39 PM

## 2016-11-20 LAB — TYPE AND SCREEN
BLOOD PRODUCT EXPIRATION DATE: 201802282359
Blood Product Expiration Date: 201802282359
UNIT TYPE AND RH: 6200
Unit Type and Rh: 6200

## 2016-11-20 NOTE — Lactation Note (Signed)
This note was copied from a baby's chart. Lactation Consultation Note  Patient Name: Leslie [name] ZOXWR'UToday's Date: 11/20/2016 Reason for consult: Follow-up assessment;NICU baby   Follow up with mom on High Risk OB. Mom reports infant is dong better. She reports she is pumping and getting a lot of EBM. She has no questions/concerns at this time. Mom requests 2 week pump rental. Pump paperwork given, will follow up to rent pump.   Maternal Data Formula Feeding for Exclusion: No Has patient been taught Hand Expression?: Yes  Feeding    LATCH Score/Interventions                      Lactation Tools Discussed/Used WIC Program: No Pump Review: Setup, frequency, and cleaning;Milk Storage Initiated by:: Reviewed   Consult Status Consult Status: PRN Follow-up type: Call as needed    Ed BlalockSharon S Annina Piotrowski 11/20/2016, 10:57 AM

## 2016-11-20 NOTE — Lactation Note (Signed)
This note was copied from a baby's chart. Lactation Consultation Note  Patient Name: Girl Larry Sierrasatreka Umscheid WUJWJ'XToday's Date: 11/20/2016 Reason for consult: Follow-up assessment;Pump rental   2 week Pump rental completed. Mom is using Maintenance setting for pumping. Mom without questions/concerns at this time. Enc her to call with any questions/concerns.    Maternal Data Formula Feeding for Exclusion: No Has patient been taught Hand Expression?: Yes  Feeding    LATCH Score/Interventions                      Lactation Tools Discussed/Used WIC Program: No Pump Review: Setup, frequency, and cleaning;Milk Storage Initiated by:: Reviewed   Consult Status Consult Status: PRN Follow-up type: Call as needed    Ed BlalockSharon S Cherrill Scrima 11/20/2016, 11:46 AM

## 2016-11-20 NOTE — Discharge Summary (Signed)
OB Discharge Summary     Patient Name: Leslie [name] DOB: Oct 26, 1985 MRN: [id number]  Date of admission: 11/17/2016 Delivering MD: Myna Hidalgo   Date of discharge: 11/20/2016  Admitting diagnosis: Z98.891 History of C-Section Intrauterine pregnancy: [redacted]w[redacted]d     Secondary diagnosis:  Principal Problem:   Status post repeat low transverse cesarean section  Additional problems: None     Discharge diagnosis: Preterm Pregnancy Delivered                                                                                                Post partum procedures:None  Augmentation: None  Complications: None  Hospital course:  Sceduled C/S   31 y.o. yo G3P2102 at [redacted]w[redacted]d was admitted to the hospital 11/17/2016 for scheduled cesarean section with the following indication:Elective Repeat and prior classical incison.  Membrane Rupture Time/Date: 8:02 AM ,11/17/2016   Patient delivered a Viable infant.11/17/2016  Details of operation can be found in separate operative note.  Pateint had an uncomplicated postpartum course.  She is ambulating, tolerating a regular diet, passing flatus, and urinating well. Patient is discharged home in stable condition on  11/20/16         Physical exam  Vitals:   11/19/16 0513 11/19/16 0803 11/19/16 1952 11/20/16 0906  BP: 109/68 112/70 113/60 114/74  Pulse: 67 65 67 68  Resp: 20 18 16 16   Temp: 98.7 F (37.1 C) 98.9 F (37.2 C) 98.3 F (36.8 C) 97.7 F (36.5 C)  TempSrc: Oral Oral Oral Oral  SpO2: 97% 98% 97% 100%  Weight:      Height:       General: alert, cooperative and no distress Lochia: appropriate Uterine Fundus: firm Incision: Healing well with no significant drainage, No significant erythema DVT Evaluation: No evidence of DVT seen on physical exam. Negative Homan's sign. Labs: Lab Results  Component Value Date   WBC 10.4 11/18/2016   HGB 11.3 (L) 11/18/2016   HCT 32.7 (L) 11/18/2016   MCV 89.1 11/18/2016   PLT 139 (L) 11/18/2016   No  flowsheet data found.  Discharge instruction: per After Visit Summary and "Baby and Me Booklet".  After visit meds:  Allergies as of 11/20/2016   No Known Allergies     Medication List    TAKE these medications   docusate sodium 100 MG capsule Commonly known as:  COLACE Take 1 capsule (100 mg total) by mouth 2 (two) times daily.   ibuprofen 600 MG tablet Commonly known as:  ADVIL,MOTRIN Take 1 tablet (600 mg total) by mouth every 6 (six) hours.   oxyCODONE 5 MG immediate release tablet Commonly known as:  Oxy IR/ROXICODONE Take 1 tablet (5 mg total) by mouth every 6 (six) hours as needed (pain scale 4-7).   prenatal multivitamin Tabs tablet Take 1 tablet by mouth daily at 12 noon.       Diet: routine diet  Activity: Advance as tolerated. Pelvic rest for 6 weeks.   Outpatient follow up:2 weeks Follow up Appt:No future appointments. Follow up Visit:No Follow-up on file.  Postpartum contraception: Tubal Ligation  Newborn Data:  Live born female  Birth Weight: 6 lb 15.1 oz (3150 g) APGAR: 1, 5  Baby Feeding: Breast Disposition:NICU   11/20/2016 Kenney HousemanNancy Jean Prothero, CNM

## 2016-11-20 NOTE — Progress Notes (Signed)
Discharge instructions reviewed, all questions answered.  No equipment send home with patient, in stable condition upon discharge.  Pt went to the NICU to see baby.

## 2016-12-02 ENCOUNTER — Ambulatory Visit: Payer: Self-pay

## 2016-12-02 NOTE — Lactation Note (Signed)
This note was copied from a baby's chart. Lactation Consultation Note  Patient Name: Leslie [name] ZOXWR'UToday's Date: 12/02/2016  Follow up visit made.  Mom holding baby skin to skin on chest.  She states pumping is going well and she has abundant supply.  She is pumping every three hours.  Mom praised for her efforts.  Encouraged to call with concerns prn.   Maternal Data    Feeding Feeding Type: Breast Milk  LATCH Score/Interventions                      Lactation Tools Discussed/Used     Consult Status      Huston FoleyMOULDEN, Ladoris Lythgoe S 12/02/2016, 2:52 PM

## 2016-12-06 DIAGNOSIS — R030 Elevated blood-pressure reading, without diagnosis of hypertension: Secondary | ICD-10-CM | POA: Diagnosis not present

## 2016-12-19 ENCOUNTER — Ambulatory Visit: Payer: Self-pay

## 2016-12-19 NOTE — Lactation Note (Signed)
This note was copied from a baby's chart. Lactation Consultation Note  Patient Name: Leslie [name] ZOXWR'UToday's Date: 12/19/2016 Reason for consult: Follow-up assessment;NICU baby   Follow up with mom at infant's bedside. Mom reports infant is awaiting G tube placement for d/c. Infant developmentally not able to go to breast.  Mom reports she has decreased her pumping to every 6 hours about a week ago and is pumping 36 oz/ pumping. She reports she has not noticed a decrease in her milk supply. She reports she has over 200 bags breast milk in the freezer. Enc her to increase pumpings as needed if she notices a dip in milk production. Mom without any questions/concerns at this time. She is looking forward to getting [name].    Maternal Data Formula Feeding for Exclusion: No  Feeding    LATCH Score/Interventions                      Lactation Tools Discussed/Used     Consult Status Consult Status: PRN Follow-up type: Call as needed    Ed BlalockSharon S Fiorela Pelzer 12/19/2016, 10:47 AM

## 2017-03-18 NOTE — Addendum Note (Signed)
Addendum  created 03/18/17 0859 by Lekeshia Kram, MD   Sign clinical note    

## 2017-04-25 ENCOUNTER — Encounter (INDEPENDENT_AMBULATORY_CARE_PROVIDER_SITE_OTHER): Payer: Self-pay

## 2018-01-08 ENCOUNTER — Other Ambulatory Visit: Payer: Self-pay | Admitting: Surgery

## 2018-01-08 DIAGNOSIS — K429 Umbilical hernia without obstruction or gangrene: Secondary | ICD-10-CM | POA: Diagnosis not present

## 2018-03-23 ENCOUNTER — Encounter (HOSPITAL_BASED_OUTPATIENT_CLINIC_OR_DEPARTMENT_OTHER): Payer: Self-pay

## 2018-03-23 ENCOUNTER — Ambulatory Visit (HOSPITAL_BASED_OUTPATIENT_CLINIC_OR_DEPARTMENT_OTHER): Admit: 2018-03-23 | Payer: BLUE CROSS/BLUE SHIELD | Admitting: Surgery

## 2018-03-23 SURGERY — REPAIR, HERNIA, UMBILICAL, ADULT
Anesthesia: General

## 2018-04-04 DIAGNOSIS — Z9851 Tubal ligation status: Secondary | ICD-10-CM | POA: Diagnosis not present

## 2018-04-04 DIAGNOSIS — Z319 Encounter for procreative management, unspecified: Secondary | ICD-10-CM | POA: Diagnosis not present

## 2018-04-06 DIAGNOSIS — Z113 Encounter for screening for infections with a predominantly sexual mode of transmission: Secondary | ICD-10-CM | POA: Diagnosis not present

## 2018-04-13 DIAGNOSIS — N8500 Endometrial hyperplasia, unspecified: Secondary | ICD-10-CM | POA: Diagnosis not present

## 2018-04-16 MED FILL — BD 3 ML SYRINGE 18GX1-1/2: 18G X 1-1/2 | 30 days supply | Qty: 50 | Fill #0

## 2018-04-16 MED FILL — GONAL-F 1,050 UNITS VIAL: 1050 | 2 days supply | Qty: 2 | Fill #0

## 2018-04-16 MED FILL — SHARPS COLLECTOR 1.4QT: 28 days supply | Qty: 1 | Fill #0

## 2018-04-16 MED FILL — BD NEEDLES 30GX0.5: 30G X 1/2" | 20 days supply | Qty: 20 | Fill #0

## 2018-04-16 MED FILL — ESTRADIOL 2 MG TABLET: 2 | 30 days supply | Qty: 60 | Fill #0

## 2018-04-16 MED FILL — METHYLPREDNISOLONE 4 MG TAB: 4 | 4 days supply | Qty: 16 | Fill #0

## 2018-04-16 MED FILL — BD NEEDLES 22GX1.5": 22G X 1-1/2 | 15 days supply | Qty: 30 | Fill #0

## 2018-04-16 MED FILL — MENOPUR 75 UNIT VIAL: 75 | 10 days supply | Qty: 10 | Fill #0

## 2018-04-16 MED FILL — ESTRADIOL 0.1 MG PATCH: 0.1 | 28 days supply | Qty: 8 | Fill #0

## 2018-04-16 MED FILL — BD 3 ML SYRINGE 18GX1-1/2": 18G X 1-1/2 | 30 days supply | Qty: 50 | Fill #0

## 2018-04-16 MED FILL — BD NEEDLES 22GX1.5: 22G X 1-1/2 | 15 days supply | Qty: 30 | Fill #0

## 2018-04-16 MED FILL — DOXYCYCLINE HYCLATE 100 MG: 100 | 20 days supply | Qty: 40 | Fill #0

## 2018-04-16 MED FILL — NOVAREL 5000 UNIT SOLR: 5000 | 1 days supply | Qty: 2 | Fill #0

## 2018-04-16 MED FILL — PROGESTERONE OIL 50 MG/ML V: 50 | 30 days supply | Qty: 30 | Fill #0

## 2018-04-16 MED FILL — BD NEEDLES 30GX0.5": 30G X 1/2" | 20 days supply | Qty: 20 | Fill #0

## 2018-04-30 DIAGNOSIS — Z3183 Encounter for assisted reproductive fertility procedure cycle: Secondary | ICD-10-CM | POA: Diagnosis not present

## 2018-05-07 DIAGNOSIS — Z3183 Encounter for assisted reproductive fertility procedure cycle: Secondary | ICD-10-CM | POA: Diagnosis not present

## 2018-05-10 DIAGNOSIS — Z3183 Encounter for assisted reproductive fertility procedure cycle: Secondary | ICD-10-CM | POA: Diagnosis not present

## 2018-05-16 DIAGNOSIS — Z319 Encounter for procreative management, unspecified: Secondary | ICD-10-CM | POA: Diagnosis not present

## 2018-06-16 DIAGNOSIS — Z3183 Encounter for assisted reproductive fertility procedure cycle: Secondary | ICD-10-CM | POA: Diagnosis not present

## 2018-07-09 DIAGNOSIS — Z3183 Encounter for assisted reproductive fertility procedure cycle: Secondary | ICD-10-CM | POA: Diagnosis not present

## 2018-07-11 DIAGNOSIS — Z3183 Encounter for assisted reproductive fertility procedure cycle: Secondary | ICD-10-CM | POA: Diagnosis not present

## 2018-07-14 DIAGNOSIS — Z3183 Encounter for assisted reproductive fertility procedure cycle: Secondary | ICD-10-CM | POA: Diagnosis not present

## 2018-08-03 DIAGNOSIS — Z3183 Encounter for assisted reproductive fertility procedure cycle: Secondary | ICD-10-CM | POA: Diagnosis not present

## 2018-08-09 DIAGNOSIS — Z3183 Encounter for assisted reproductive fertility procedure cycle: Secondary | ICD-10-CM | POA: Diagnosis not present

## 2018-08-22 MED FILL — ESTRADIOL 2 MG TABLET: 2 | 30 days supply | Qty: 60 | Fill #1

## 2018-08-22 MED FILL — ESTRADIOL 0.1 MG PATCH: 0.1 | 28 days supply | Qty: 8 | Fill #1

## 2018-08-31 DIAGNOSIS — Z3183 Encounter for assisted reproductive fertility procedure cycle: Secondary | ICD-10-CM | POA: Diagnosis not present

## 2018-09-04 MED FILL — METHYLPREDNISOLONE 4 MG TAB: 4 | 8 days supply | Qty: 32 | Fill #0

## 2018-09-18 MED FILL — ESTRADIOL 0.1 MG PATCH: 0.1 | 28 days supply | Qty: 8 | Fill #2

## 2018-09-19 DIAGNOSIS — Z3183 Encounter for assisted reproductive fertility procedure cycle: Secondary | ICD-10-CM | POA: Diagnosis not present

## 2018-09-28 MED FILL — ESTRADIOL 2 MG TABLET: 2 | 30 days supply | Qty: 60 | Fill #2

## 2018-09-28 MED FILL — MEDROXYPROGESTERONE 10 MG T: 10 | 5 days supply | Qty: 5 | Fill #0

## 2018-09-28 MED FILL — DOXYCYCLINE HYCLATE 100 MG: 100 | 5 days supply | Qty: 10 | Fill #0

## 2018-11-12 DIAGNOSIS — Z3183 Encounter for assisted reproductive fertility procedure cycle: Secondary | ICD-10-CM | POA: Diagnosis not present

## 2018-11-12 MED FILL — BD NEEDLES 22GX1.5: 22G X 1-1/2 | 15 days supply | Qty: 30 | Fill #1

## 2018-11-12 MED FILL — ESTRADIOL 0.1 MG PATCH: 0.1 | 28 days supply | Qty: 8 | Fill #0

## 2018-11-12 MED FILL — PROGESTERONE OIL 50 MG/ML V: 50 | 30 days supply | Qty: 30 | Fill #1

## 2018-11-12 MED FILL — ESTRADIOL 2 MG TABLET: 2 | 30 days supply | Qty: 60 | Fill #0

## 2018-11-12 MED FILL — BD 3 ML SYRINGE 18GX1-1/2": 18G X 1-1/2 | 30 days supply | Qty: 50 | Fill #1

## 2018-11-12 MED FILL — NEUPOGEN 300 MCG/0.5 ML SYR: 300 | 1 days supply | Qty: 1 | Fill #0

## 2018-11-12 MED FILL — BD NEEDLES 22GX1.5": 22G X 1-1/2 | 15 days supply | Qty: 30 | Fill #1

## 2018-11-12 MED FILL — BD 3 ML SYRINGE 18GX1-1/2: 18G X 1-1/2 | 30 days supply | Qty: 50 | Fill #1

## 2018-11-21 DIAGNOSIS — Z3183 Encounter for assisted reproductive fertility procedure cycle: Secondary | ICD-10-CM | POA: Diagnosis not present

## 2018-12-03 MED FILL — ESTRADIOL 0.1 MG PATCH: 0.1 | 28 days supply | Qty: 8 | Fill #1

## 2018-12-14 MED FILL — ESTRADIOL 2 MG TABLET: 2 | 30 days supply | Qty: 60 | Fill #1

## 2018-12-14 MED FILL — BD NEEDLES 22GX1.5": 22G X 1-1/2 | 15 days supply | Qty: 30 | Fill #2

## 2018-12-14 MED FILL — BD 3 ML SYRINGE 18GX1-1/2: 18G X 1-1/2 | 30 days supply | Qty: 50 | Fill #2

## 2018-12-14 MED FILL — BD 3 ML SYRINGE 18GX1-1/2": 18G X 1-1/2 | 30 days supply | Qty: 50 | Fill #2

## 2018-12-14 MED FILL — BD NEEDLES 22GX1.5: 22G X 1-1/2 | 15 days supply | Qty: 30 | Fill #2

## 2018-12-14 MED FILL — FOLIC ACID 1 MG TABS: 1 | 30 days supply | Qty: 30 | Fill #0

## 2018-12-14 MED FILL — PROGESTERONE OIL 50 MG/ML V: 50 | 30 days supply | Qty: 30 | Fill #2

## 2019-01-02 MED FILL — PROGESTERONE MICRONIZED 200: 200 | 15 days supply | Qty: 15 | Fill #0

## 2019-01-03 DIAGNOSIS — Z3490 Encounter for supervision of normal pregnancy, unspecified, unspecified trimester: Secondary | ICD-10-CM | POA: Diagnosis not present

## 2019-01-03 LAB — OB RESULTS CONSOLE RUBELLA ANTIBODY, IGM: Rubella: IMMUNE

## 2019-01-03 LAB — OB RESULTS CONSOLE GC/CHLAMYDIA
Chlamydia: NEGATIVE
Gonorrhea: NEGATIVE

## 2019-01-03 LAB — OB RESULTS CONSOLE HEPATITIS B SURFACE ANTIGEN: Hepatitis B Surface Ag: NEGATIVE

## 2019-01-03 LAB — OB RESULTS CONSOLE ANTIBODY SCREEN: Antibody Screen: NEGATIVE

## 2019-01-03 LAB — OB RESULTS CONSOLE ABO/RH: RH Type: POSITIVE

## 2019-01-03 LAB — OB RESULTS CONSOLE RPR: RPR: NONREACTIVE

## 2019-01-03 LAB — OB RESULTS CONSOLE HIV ANTIBODY (ROUTINE TESTING): HIV: NONREACTIVE

## 2019-01-28 ENCOUNTER — Other Ambulatory Visit: Payer: Self-pay | Admitting: Obstetrics and Gynecology

## 2019-01-28 ENCOUNTER — Other Ambulatory Visit (HOSPITAL_COMMUNITY)
Admission: RE | Admit: 2019-01-28 | Discharge: 2019-01-28 | Disposition: A | Discharge status: Home / Self Care | Payer: No Typology Code available for payment source | Source: Ambulatory Visit | Attending: Obstetrics and Gynecology | Admitting: Obstetrics and Gynecology

## 2019-01-28 DIAGNOSIS — Z124 Encounter for screening for malignant neoplasm of cervix: Secondary | ICD-10-CM | POA: Insufficient documentation

## 2019-02-19 LAB — CYTOLOGY - PAP
Diagnosis: NEGATIVE
HPV: NOT DETECTED

## 2019-02-21 DIAGNOSIS — O30042 Twin pregnancy, dichorionic/diamniotic, second trimester: Secondary | ICD-10-CM | POA: Diagnosis not present

## 2019-02-21 DIAGNOSIS — Z3482 Encounter for supervision of other normal pregnancy, second trimester: Secondary | ICD-10-CM | POA: Diagnosis not present

## 2019-03-13 MED FILL — BUTALB-ACETAMIN-CAFF 50-300: 50-300-40 | 30 days supply | Qty: 30 | Fill #0

## 2019-03-20 DIAGNOSIS — O30042 Twin pregnancy, dichorionic/diamniotic, second trimester: Secondary | ICD-10-CM | POA: Diagnosis not present

## 2019-03-20 DIAGNOSIS — Z36 Encounter for antenatal screening for chromosomal anomalies: Secondary | ICD-10-CM | POA: Diagnosis not present

## 2019-03-26 DIAGNOSIS — Z3A20 20 weeks gestation of pregnancy: Secondary | ICD-10-CM | POA: Diagnosis not present

## 2019-03-26 DIAGNOSIS — O352XX0 Maternal care for (suspected) hereditary disease in fetus, not applicable or unspecified: Secondary | ICD-10-CM | POA: Diagnosis not present

## 2019-04-17 DIAGNOSIS — O30042 Twin pregnancy, dichorionic/diamniotic, second trimester: Secondary | ICD-10-CM | POA: Diagnosis not present

## 2019-05-15 ENCOUNTER — Encounter (HOSPITAL_COMMUNITY): Payer: Self-pay

## 2019-05-15 DIAGNOSIS — Z113 Encounter for screening for infections with a predominantly sexual mode of transmission: Secondary | ICD-10-CM | POA: Diagnosis not present

## 2019-05-16 ENCOUNTER — Other Ambulatory Visit (HOSPITAL_COMMUNITY): Payer: Self-pay | Admitting: Obstetrics and Gynecology

## 2019-05-16 DIAGNOSIS — O283 Abnormal ultrasonic finding on antenatal screening of mother: Secondary | ICD-10-CM

## 2019-05-17 DIAGNOSIS — O1212 Gestational proteinuria, second trimester: Secondary | ICD-10-CM | POA: Diagnosis not present

## 2019-05-21 ENCOUNTER — Encounter (HOSPITAL_COMMUNITY): Payer: Self-pay | Admitting: *Deleted

## 2019-05-23 ENCOUNTER — Ambulatory Visit (HOSPITAL_COMMUNITY): Payer: No Typology Code available for payment source

## 2019-05-23 ENCOUNTER — Encounter (HOSPITAL_COMMUNITY): Payer: Self-pay

## 2019-05-24 ENCOUNTER — Ambulatory Visit (HOSPITAL_COMMUNITY)
Admission: RE | Admit: 2019-05-24 | Discharge: 2019-05-24 | Disposition: A | Discharge status: Home / Self Care | Payer: No Typology Code available for payment source | Source: Ambulatory Visit | Attending: Obstetrics and Gynecology | Admitting: Obstetrics and Gynecology

## 2019-05-24 ENCOUNTER — Other Ambulatory Visit (HOSPITAL_COMMUNITY): Payer: Self-pay | Admitting: Obstetrics and Gynecology

## 2019-05-24 ENCOUNTER — Ambulatory Visit (HOSPITAL_COMMUNITY): Payer: No Typology Code available for payment source | Admitting: *Deleted

## 2019-05-24 ENCOUNTER — Encounter (HOSPITAL_COMMUNITY): Payer: Self-pay

## 2019-05-24 ENCOUNTER — Other Ambulatory Visit: Payer: Self-pay

## 2019-05-24 VITALS — BP 131/70 | HR 89 | Temp 98.5°F

## 2019-05-24 DIAGNOSIS — O283 Abnormal ultrasonic finding on antenatal screening of mother: Secondary | ICD-10-CM

## 2019-05-24 DIAGNOSIS — O09213 Supervision of pregnancy with history of pre-term labor, third trimester: Secondary | ICD-10-CM | POA: Diagnosis not present

## 2019-05-24 DIAGNOSIS — O30009 Twin pregnancy, unspecified number of placenta and unspecified number of amniotic sacs, unspecified trimester: Secondary | ICD-10-CM

## 2019-05-24 DIAGNOSIS — O34219 Maternal care for unspecified type scar from previous cesarean delivery: Secondary | ICD-10-CM

## 2019-05-24 DIAGNOSIS — O09819 Supervision of pregnancy resulting from assisted reproductive technology, unspecified trimester: Secondary | ICD-10-CM

## 2019-05-24 DIAGNOSIS — O09219 Supervision of pregnancy with history of pre-term labor, unspecified trimester: Secondary | ICD-10-CM

## 2019-05-24 DIAGNOSIS — O289 Unspecified abnormal findings on antenatal screening of mother: Secondary | ICD-10-CM | POA: Diagnosis not present

## 2019-05-24 DIAGNOSIS — Z3A29 29 weeks gestation of pregnancy: Secondary | ICD-10-CM

## 2019-05-24 DIAGNOSIS — O09813 Supervision of pregnancy resulting from assisted reproductive technology, third trimester: Secondary | ICD-10-CM

## 2019-05-24 DIAGNOSIS — O30043 Twin pregnancy, dichorionic/diamniotic, third trimester: Secondary | ICD-10-CM | POA: Diagnosis not present

## 2019-05-24 DIAGNOSIS — O99213 Obesity complicating pregnancy, third trimester: Secondary | ICD-10-CM

## 2019-05-24 HISTORY — DX: Umbilical hernia without obstruction or gangrene: K42.9

## 2019-05-24 HISTORY — DX: Migraine, unspecified, not intractable, without status migrainosus: G43.909

## 2019-05-24 HISTORY — DX: Headache, unspecified: R51.9

## 2019-05-27 ENCOUNTER — Other Ambulatory Visit (HOSPITAL_COMMUNITY): Payer: Self-pay | Admitting: *Deleted

## 2019-05-27 DIAGNOSIS — O30003 Twin pregnancy, unspecified number of placenta and unspecified number of amniotic sacs, third trimester: Secondary | ICD-10-CM

## 2019-05-30 ENCOUNTER — Encounter (HOSPITAL_COMMUNITY): Payer: Self-pay

## 2019-05-30 ENCOUNTER — Ambulatory Visit (HOSPITAL_COMMUNITY): Payer: No Typology Code available for payment source

## 2019-06-03 ENCOUNTER — Other Ambulatory Visit: Payer: Self-pay

## 2019-06-03 ENCOUNTER — Ambulatory Visit (HOSPITAL_COMMUNITY): Payer: No Typology Code available for payment source | Admitting: *Deleted

## 2019-06-03 ENCOUNTER — Ambulatory Visit (HOSPITAL_COMMUNITY)
Admission: RE | Admit: 2019-06-03 | Discharge: 2019-06-03 | Disposition: A | Discharge status: Home / Self Care | Payer: No Typology Code available for payment source | Source: Ambulatory Visit | Attending: Obstetrics and Gynecology | Admitting: Obstetrics and Gynecology

## 2019-06-03 ENCOUNTER — Other Ambulatory Visit (HOSPITAL_COMMUNITY): Payer: Self-pay | Admitting: Obstetrics and Gynecology

## 2019-06-03 ENCOUNTER — Encounter (HOSPITAL_COMMUNITY): Payer: Self-pay

## 2019-06-03 VITALS — BP 122/77 | HR 91 | Temp 98.4°F

## 2019-06-03 DIAGNOSIS — O365930 Maternal care for other known or suspected poor fetal growth, third trimester, not applicable or unspecified: Secondary | ICD-10-CM | POA: Diagnosis not present

## 2019-06-03 DIAGNOSIS — O09813 Supervision of pregnancy resulting from assisted reproductive technology, third trimester: Secondary | ICD-10-CM

## 2019-06-03 DIAGNOSIS — O30003 Twin pregnancy, unspecified number of placenta and unspecified number of amniotic sacs, third trimester: Secondary | ICD-10-CM

## 2019-06-03 DIAGNOSIS — O289 Unspecified abnormal findings on antenatal screening of mother: Secondary | ICD-10-CM

## 2019-06-03 DIAGNOSIS — O30043 Twin pregnancy, dichorionic/diamniotic, third trimester: Secondary | ICD-10-CM

## 2019-06-03 DIAGNOSIS — O09213 Supervision of pregnancy with history of pre-term labor, third trimester: Secondary | ICD-10-CM

## 2019-06-03 DIAGNOSIS — O34219 Maternal care for unspecified type scar from previous cesarean delivery: Secondary | ICD-10-CM | POA: Diagnosis not present

## 2019-06-03 DIAGNOSIS — Z3A30 30 weeks gestation of pregnancy: Secondary | ICD-10-CM

## 2019-06-03 DIAGNOSIS — O99213 Obesity complicating pregnancy, third trimester: Secondary | ICD-10-CM

## 2019-06-10 ENCOUNTER — Encounter (HOSPITAL_COMMUNITY): Payer: Self-pay

## 2019-06-10 ENCOUNTER — Other Ambulatory Visit: Payer: Self-pay

## 2019-06-10 ENCOUNTER — Other Ambulatory Visit (HOSPITAL_COMMUNITY): Payer: Self-pay | Admitting: *Deleted

## 2019-06-10 ENCOUNTER — Ambulatory Visit (HOSPITAL_COMMUNITY): Payer: No Typology Code available for payment source | Admitting: *Deleted

## 2019-06-10 ENCOUNTER — Ambulatory Visit (HOSPITAL_COMMUNITY)
Admission: RE | Admit: 2019-06-10 | Discharge: 2019-06-10 | Disposition: A | Discharge status: Home / Self Care | Payer: No Typology Code available for payment source | Source: Ambulatory Visit | Attending: Obstetrics and Gynecology | Admitting: Obstetrics and Gynecology

## 2019-06-10 VITALS — BP 116/67 | HR 90 | Temp 98.4°F

## 2019-06-10 DIAGNOSIS — O30043 Twin pregnancy, dichorionic/diamniotic, third trimester: Secondary | ICD-10-CM | POA: Insufficient documentation

## 2019-06-10 DIAGNOSIS — O289 Unspecified abnormal findings on antenatal screening of mother: Secondary | ICD-10-CM

## 2019-06-10 DIAGNOSIS — O30049 Twin pregnancy, dichorionic/diamniotic, unspecified trimester: Secondary | ICD-10-CM

## 2019-06-10 DIAGNOSIS — O09219 Supervision of pregnancy with history of pre-term labor, unspecified trimester: Secondary | ICD-10-CM

## 2019-06-10 DIAGNOSIS — O09813 Supervision of pregnancy resulting from assisted reproductive technology, third trimester: Secondary | ICD-10-CM

## 2019-06-10 DIAGNOSIS — Z362 Encounter for other antenatal screening follow-up: Secondary | ICD-10-CM | POA: Diagnosis not present

## 2019-06-10 DIAGNOSIS — O30003 Twin pregnancy, unspecified number of placenta and unspecified number of amniotic sacs, third trimester: Secondary | ICD-10-CM | POA: Diagnosis not present

## 2019-06-10 DIAGNOSIS — O34219 Maternal care for unspecified type scar from previous cesarean delivery: Secondary | ICD-10-CM

## 2019-06-10 DIAGNOSIS — Z3A31 31 weeks gestation of pregnancy: Secondary | ICD-10-CM

## 2019-06-10 DIAGNOSIS — O99213 Obesity complicating pregnancy, third trimester: Secondary | ICD-10-CM

## 2019-06-10 DIAGNOSIS — O09213 Supervision of pregnancy with history of pre-term labor, third trimester: Secondary | ICD-10-CM

## 2019-06-10 DIAGNOSIS — O365930 Maternal care for other known or suspected poor fetal growth, third trimester, not applicable or unspecified: Secondary | ICD-10-CM | POA: Diagnosis not present

## 2019-06-17 DIAGNOSIS — O0993 Supervision of high risk pregnancy, unspecified, third trimester: Secondary | ICD-10-CM | POA: Diagnosis not present

## 2019-06-17 DIAGNOSIS — O30043 Twin pregnancy, dichorionic/diamniotic, third trimester: Secondary | ICD-10-CM | POA: Diagnosis not present

## 2019-06-25 DIAGNOSIS — Z23 Encounter for immunization: Secondary | ICD-10-CM | POA: Diagnosis not present

## 2019-06-25 DIAGNOSIS — O30043 Twin pregnancy, dichorionic/diamniotic, third trimester: Secondary | ICD-10-CM | POA: Diagnosis not present

## 2019-06-26 LAB — HM PAP SMEAR: PAP Smear, External: NORMAL

## 2019-06-27 ENCOUNTER — Encounter (HOSPITAL_COMMUNITY): Payer: Self-pay

## 2019-06-27 ENCOUNTER — Telehealth (HOSPITAL_COMMUNITY): Payer: Self-pay | Admitting: *Deleted

## 2019-06-27 NOTE — Telephone Encounter (Signed)
Preadmission screen  

## 2019-06-27 NOTE — Patient Instructions (Signed)
Maevyn Riordan  06/27/2019   Your procedure is scheduled on:  07/12/2019  Arrive at [facility] at Entrance C on [address] at [facility]  and [employer] [employer]. You are invited to use the FREE valet parking or use the Visitor's parking deck.  Pick up the phone at the desk and dial [phone].  Call this number if you have problems the morning of surgery: [phone]  Remember:   Do not eat food:(After Midnight) Desps de medianoche.  Do not drink clear liquids: (After Midnight) Desps de medianoche.  Take these medicines the morning of surgery with A SIP OF WATER:  none   Do not wear jewelry, make-up or nail polish.  Do not wear lotions, powders, or perfumes. Do not wear deodorant.  Do not shave 48 hours prior to surgery.  Do not bring valuables to the hospital.  [facility] is not   responsible for any belongings or valuables brought to the hospital.  Contacts, dentures or bridgework may not be worn into surgery.  Leave suitcase in the car. After surgery it may be brought to your room.  For patients admitted to the hospital, checkout time is 11:00 AM the day of              discharge.      Please read over the following fact sheets that you were given:     Preparing for Surgery

## 2019-06-28 ENCOUNTER — Telehealth (HOSPITAL_COMMUNITY): Payer: Self-pay | Admitting: *Deleted

## 2019-06-28 NOTE — Telephone Encounter (Signed)
Preadmission screen  

## 2019-07-01 ENCOUNTER — Other Ambulatory Visit (HOSPITAL_COMMUNITY): Payer: Self-pay | Admitting: *Deleted

## 2019-07-01 ENCOUNTER — Other Ambulatory Visit (HOSPITAL_COMMUNITY): Payer: Self-pay | Admitting: Obstetrics and Gynecology

## 2019-07-01 ENCOUNTER — Ambulatory Visit (HOSPITAL_COMMUNITY): Payer: No Typology Code available for payment source | Admitting: *Deleted

## 2019-07-01 ENCOUNTER — Other Ambulatory Visit: Payer: Self-pay

## 2019-07-01 ENCOUNTER — Ambulatory Visit (HOSPITAL_COMMUNITY)
Admission: RE | Admit: 2019-07-01 | Discharge: 2019-07-01 | Disposition: A | Discharge status: Home / Self Care | Payer: No Typology Code available for payment source | Source: Ambulatory Visit | Attending: Obstetrics and Gynecology | Admitting: Obstetrics and Gynecology

## 2019-07-01 ENCOUNTER — Encounter (HOSPITAL_COMMUNITY): Payer: Self-pay

## 2019-07-01 VITALS — BP 120/79 | HR 90 | Temp 98.4°F

## 2019-07-01 DIAGNOSIS — O30049 Twin pregnancy, dichorionic/diamniotic, unspecified trimester: Secondary | ICD-10-CM

## 2019-07-01 DIAGNOSIS — O09213 Supervision of pregnancy with history of pre-term labor, third trimester: Secondary | ICD-10-CM

## 2019-07-01 DIAGNOSIS — O289 Unspecified abnormal findings on antenatal screening of mother: Secondary | ICD-10-CM

## 2019-07-01 DIAGNOSIS — Z362 Encounter for other antenatal screening follow-up: Secondary | ICD-10-CM | POA: Diagnosis not present

## 2019-07-01 DIAGNOSIS — Z3A34 34 weeks gestation of pregnancy: Secondary | ICD-10-CM

## 2019-07-01 DIAGNOSIS — O30043 Twin pregnancy, dichorionic/diamniotic, third trimester: Secondary | ICD-10-CM | POA: Insufficient documentation

## 2019-07-01 DIAGNOSIS — O09219 Supervision of pregnancy with history of pre-term labor, unspecified trimester: Secondary | ICD-10-CM

## 2019-07-01 DIAGNOSIS — Z3689 Encounter for other specified antenatal screening: Secondary | ICD-10-CM

## 2019-07-01 DIAGNOSIS — O34219 Maternal care for unspecified type scar from previous cesarean delivery: Secondary | ICD-10-CM

## 2019-07-01 DIAGNOSIS — O99213 Obesity complicating pregnancy, third trimester: Secondary | ICD-10-CM

## 2019-07-01 DIAGNOSIS — O30003 Twin pregnancy, unspecified number of placenta and unspecified number of amniotic sacs, third trimester: Secondary | ICD-10-CM

## 2019-07-01 DIAGNOSIS — O09813 Supervision of pregnancy resulting from assisted reproductive technology, third trimester: Secondary | ICD-10-CM

## 2019-07-03 ENCOUNTER — Encounter (HOSPITAL_COMMUNITY): Payer: Self-pay

## 2019-07-08 ENCOUNTER — Other Ambulatory Visit: Payer: Self-pay | Admitting: Obstetrics and Gynecology

## 2019-07-09 ENCOUNTER — Ambulatory Visit (HOSPITAL_COMMUNITY): Admission: RE | Admit: 2019-07-09 | Payer: No Typology Code available for payment source | Source: Ambulatory Visit

## 2019-07-09 ENCOUNTER — Ambulatory Visit (HOSPITAL_COMMUNITY): Payer: No Typology Code available for payment source

## 2019-07-09 ENCOUNTER — Inpatient Hospital Stay (HOSPITAL_BASED_OUTPATIENT_CLINIC_OR_DEPARTMENT_OTHER): Payer: No Typology Code available for payment source

## 2019-07-09 ENCOUNTER — Other Ambulatory Visit: Payer: Self-pay

## 2019-07-09 ENCOUNTER — Encounter (HOSPITAL_COMMUNITY): Payer: Self-pay

## 2019-07-09 ENCOUNTER — Inpatient Hospital Stay (EMERGENCY_DEPARTMENT_HOSPITAL)
Admission: AD | Admit: 2019-07-09 | Discharge: 2019-07-09 | Disposition: A | Discharge status: Home / Self Care | Payer: No Typology Code available for payment source | Source: Home / Self Care | Attending: Obstetrics and Gynecology | Admitting: Obstetrics and Gynecology

## 2019-07-09 ENCOUNTER — Encounter (HOSPITAL_COMMUNITY): Payer: Self-pay | Admitting: *Deleted

## 2019-07-09 DIAGNOSIS — O289 Unspecified abnormal findings on antenatal screening of mother: Secondary | ICD-10-CM

## 2019-07-09 DIAGNOSIS — O09893 Supervision of other high risk pregnancies, third trimester: Secondary | ICD-10-CM

## 2019-07-09 DIAGNOSIS — O30043 Twin pregnancy, dichorionic/diamniotic, third trimester: Secondary | ICD-10-CM | POA: Diagnosis present

## 2019-07-09 DIAGNOSIS — Z3A35 35 weeks gestation of pregnancy: Secondary | ICD-10-CM | POA: Diagnosis not present

## 2019-07-09 DIAGNOSIS — O30003 Twin pregnancy, unspecified number of placenta and unspecified number of amniotic sacs, third trimester: Secondary | ICD-10-CM

## 2019-07-09 DIAGNOSIS — O09813 Supervision of pregnancy resulting from assisted reproductive technology, third trimester: Secondary | ICD-10-CM

## 2019-07-09 DIAGNOSIS — O99213 Obesity complicating pregnancy, third trimester: Secondary | ICD-10-CM

## 2019-07-09 DIAGNOSIS — O34212 Maternal care for vertical scar from previous cesarean delivery: Secondary | ICD-10-CM | POA: Diagnosis not present

## 2019-07-09 DIAGNOSIS — M6208 Separation of muscle (nontraumatic), other site: Secondary | ICD-10-CM | POA: Diagnosis not present

## 2019-07-09 DIAGNOSIS — O34219 Maternal care for unspecified type scar from previous cesarean delivery: Secondary | ICD-10-CM

## 2019-07-09 DIAGNOSIS — O9962 Diseases of the digestive system complicating childbirth: Secondary | ICD-10-CM | POA: Diagnosis not present

## 2019-07-09 DIAGNOSIS — K429 Umbilical hernia without obstruction or gangrene: Secondary | ICD-10-CM | POA: Diagnosis not present

## 2019-07-09 DIAGNOSIS — O30049 Twin pregnancy, dichorionic/diamniotic, unspecified trimester: Secondary | ICD-10-CM

## 2019-07-09 DIAGNOSIS — Z833 Family history of diabetes mellitus: Secondary | ICD-10-CM | POA: Insufficient documentation

## 2019-07-09 DIAGNOSIS — O09213 Supervision of pregnancy with history of pre-term labor, third trimester: Secondary | ICD-10-CM

## 2019-07-09 DIAGNOSIS — Z20828 Contact with and (suspected) exposure to other viral communicable diseases: Secondary | ICD-10-CM | POA: Diagnosis not present

## 2019-07-09 DIAGNOSIS — Z98891 History of uterine scar from previous surgery: Secondary | ICD-10-CM | POA: Diagnosis not present

## 2019-07-09 DIAGNOSIS — Z3689 Encounter for other specified antenatal screening: Secondary | ICD-10-CM | POA: Diagnosis not present

## 2019-07-09 MED ORDER — BETAMETHASONE SOD PHOS & ACET 6 (3-3) MG/ML IJ SUSP
12.0000 mg | INTRAMUSCULAR | Status: DC
  Administered 2019-07-09: 12 mg via INTRAMUSCULAR
  Filled 2019-07-09 (×3): qty 2

## 2019-07-09 NOTE — MAU Note (Signed)
Pt sent over for BMZ. U/S with BPP. Denies any complaints

## 2019-07-09 NOTE — MAU Provider Note (Addendum)
History     CSN: [id number]  Arrival date and time: 07/09/19 2505   First Provider Initiated Contact with Patient 07/09/19 1006      Chief Complaint  Patient presents with  . BMZ  . Pregnancy Ultrasound   HPI  Ms.  Leslie [name] is a 33 y.o. year old G80P2102 female at [redacted]w[redacted]d weeks gestation who was sent to MAU for BMZ injection. She was scheduled for a BPP at the same time she was instructed to come to MAU for BMZ. MFM called to inform triage RN that the patient has missed her BPP appt and if it could be done here. RN called Dr. Simona Huh to get orders for BPP here instead of MFM. The patient denies any contractions, leaking of fluid or VB. She is scheduled to return to MAU for 2nd BMZ injection and pre-op testing tomorrow (9/23). She reports good (+) x 2.  Past Medical History:  Diagnosis Date  . Headache   . Migraines   . Umbilical hernia     Past Surgical History:  Procedure Laterality Date  . CESAREAN SECTION    . CESAREAN SECTION WITH BILATERAL TUBAL LIGATION N/A 11/17/2016   Procedure: CESAREAN SECTION WITH BILATERAL TUBAL LIGATION;  Surgeon: Janyth Pupa, DO;  Location: [place];  Service: Obstetrics;  Laterality: N/A;    Family History  Problem Relation Age of Onset  . Hypertension Mother   . Hypertension Father   . Kidney disease Father   . Autism Brother   . Kidney disease Paternal Aunt   . Diabetes Paternal Grandfather   . Kidney disease Paternal Grandfather   . Alcohol abuse Neg Hx   . Arthritis Neg Hx   . Asthma Neg Hx   . Birth defects Neg Hx   . Cancer Neg Hx   . COPD Neg Hx   . Depression Neg Hx   . Drug abuse Neg Hx   . Early death Neg Hx   . Hearing loss Neg Hx   . Heart disease Neg Hx   . Hyperlipidemia Neg Hx   . Learning disabilities Neg Hx   . Mental illness Neg Hx   . Mental retardation Neg Hx   . Miscarriages / Stillbirths Neg Hx   . Stroke Neg Hx   . Vision loss Neg Hx   . Varicose Veins Neg Hx     Social History    Tobacco Use  . Smoking status: Never Smoker  . Smokeless tobacco: Never Used  Substance Use Topics  . Alcohol use: No  . Drug use: No    Allergies: No Known Allergies  Medications Prior to Admission  Medication Sig Dispense Refill Last Dose  . aspirin 81 MG chewable tablet Chew by mouth daily.   07/09/2019 at Unknown time  . Ferrous Sulfate (IRON PO) Take by mouth.   07/09/2019 at Unknown time  . Prenatal Vit-Fe Fumarate-FA (PRENATAL MULTIVITAMIN) TABS tablet Take 1 tablet by mouth daily at 12 noon.   07/09/2019 at Unknown time  . docusate sodium (COLACE) 100 MG capsule Take 1 capsule (100 mg total) by mouth 2 (two) times daily. (Patient not taking: Reported on 05/24/2019) 30 capsule 0   . ibuprofen (ADVIL,MOTRIN) 600 MG tablet Take 1 tablet (600 mg total) by mouth every 6 (six) hours. (Patient not taking: Reported on 05/24/2019) 30 tablet 0   . oxyCODONE (OXY IR/ROXICODONE) 5 MG immediate release tablet Take 1 tablet (5 mg total) by mouth every 6 (six) hours as needed (pain  scale 4-7). (Patient not taking: Reported on 05/24/2019) 20 tablet 0     Review of Systems  Constitutional: Negative.   HENT: Negative.   Eyes: Negative.   Respiratory: Negative.   Cardiovascular: Negative.   Gastrointestinal: Negative.   Endocrine: Negative.   Genitourinary: Negative.   Musculoskeletal: Negative.   Skin: Negative.   Allergic/Immunologic: Negative.   Neurological: Negative.   Hematological: Negative.   Psychiatric/Behavioral: Negative.    Physical Exam   Blood pressure 138/84, pulse 94, temperature 98.2 F (36.8 C), resp. rate 16, SpO2 100 %.  Physical Exam  Nursing note and vitals reviewed. Constitutional: She is oriented to person, place, and time. She appears well-developed and well-nourished.  HENT:  Head: Atraumatic.  Eyes: Pupils are equal, round, and reactive to light.  Neck: Normal range of motion.  Cardiovascular: Normal rate.  Respiratory: Effort normal.  GI: Soft.   Genitourinary:    Genitourinary Comments: Pelvic not indicated   Musculoskeletal: Normal range of motion.  Neurological: She is alert and oriented to person, place, and time. She has normal reflexes.  Skin: Skin is warm and dry.  Psychiatric: She has a normal mood and affect. Her behavior is normal. Judgment and thought content normal.    NST - FHR (A): 145 bpm / moderate variability / accels present / decels absent / NST - FHR (B): 150 bpm / moderate variability / accels present / decels absent / TOCO: none  MAU Course  Procedures  MDM BMZ 12 mg IM injection BPP/cord doppler x 2 CEFM x 2  *Consult with Dr. Alysia Penna @ 1039 - notified of patient's complaints, assessments, lab & U/S results, tx plan d/c home and return for scheduled testing and C/S on Friday 07/12/19 - ok to d/c home, agrees with plan   Assessment and Plan  Hx of preterm delivery, currently pregnant, third trimester  - Return to MAU for 2nd BMZ injection and pre-op testing on 07/10/19 as previously scheduled  Previous cesarean delivery affecting pregnancy  - Keep scheduled appt for RCS on 07/12/2019 - Emphasized the importance for not laboring with a h/o classical C/S  Advised to call her OB, if she feels contractions  Twin pregnancy, dichorionic/diamniotic, third trimester  - Advised to monitor [facility] of twins daily  - Discharge patient - Patient verbalized an understanding of the plan of care and agrees.     Raelyn Mora, MSN, CNM 07/09/2019, 10:16 AM

## 2019-07-10 ENCOUNTER — Other Ambulatory Visit (HOSPITAL_COMMUNITY)
Admission: RE | Admit: 2019-07-10 | Discharge: 2019-07-10 | Disposition: A | Discharge status: Home / Self Care | Payer: No Typology Code available for payment source | Source: Ambulatory Visit | Attending: Obstetrics and Gynecology | Admitting: Obstetrics and Gynecology

## 2019-07-10 ENCOUNTER — Inpatient Hospital Stay (HOSPITAL_COMMUNITY)
Admission: AD | Admit: 2019-07-10 | Discharge: 2019-07-10 | Disposition: A | Discharge status: Home / Self Care | Payer: No Typology Code available for payment source | Source: Home / Self Care | Attending: Obstetrics and Gynecology | Admitting: Obstetrics and Gynecology

## 2019-07-10 DIAGNOSIS — O34219 Maternal care for unspecified type scar from previous cesarean delivery: Secondary | ICD-10-CM

## 2019-07-10 DIAGNOSIS — O30043 Twin pregnancy, dichorionic/diamniotic, third trimester: Secondary | ICD-10-CM

## 2019-07-10 LAB — CBC
HCT: 36.2 % (ref 36.0–46.0)
Hemoglobin: 12.2 g/dL (ref 12.0–15.0)
MCH: 31.3 pg (ref 26.0–34.0)
MCHC: 33.7 g/dL (ref 30.0–36.0)
MCV: 92.8 fL (ref 80.0–100.0)
Platelets: 149 10*3/uL — ABNORMAL LOW (ref 150–400)
RBC: 3.9 MIL/uL (ref 3.87–5.11)
RDW: 15.4 % (ref 11.5–15.5)
WBC: 7 10*3/uL (ref 4.0–10.5)
nRBC: 0 % (ref 0.0–0.2)

## 2019-07-10 LAB — ABO/RH: ABO/RH(D): A POS

## 2019-07-10 LAB — SYPHILIS: RPR W/REFLEX TO RPR TITER AND TREPONEMAL ANTIBODIES, TRADITIONAL SCREENING AND DIAGNOSIS ALGORITHM: RPR Ser Ql: NONREACTIVE

## 2019-07-10 LAB — SARS CORONAVIRUS 2 (TAT 6-24 HRS): SARS Coronavirus 2: NEGATIVE

## 2019-07-10 MED ORDER — BETAMETHASONE SOD PHOS & ACET 6 (3-3) MG/ML IJ SUSP: 12.0000 mg | Freq: Once | INTRAMUSCULAR | Status: DC

## 2019-07-10 MED ORDER — BETAMETHASONE SOD PHOS & ACET 6 (3-3) MG/ML IJ SUSP
12.0000 mg | Freq: Once | INTRAMUSCULAR | Status: AC
  Administered 2019-07-10: 12 mg via INTRAMUSCULAR
  Filled 2019-07-10: qty 2

## 2019-07-10 NOTE — MAU Note (Signed)
Pharmacy called for 2nd dose of Betamethasone.  Lab called for blood draw

## 2019-07-10 NOTE — MAU Note (Signed)
No problems with injection yesterday. No complaints offered.

## 2019-07-10 NOTE — MAU Note (Signed)
Pt re registered.  Pharmacy unable to see order under pre-procedure listing. Medication reordered.

## 2019-07-10 NOTE — MAU Note (Signed)
Asymptomatic, swab collected. 

## 2019-07-11 LAB — TYPE AND SCREEN
ABO/RH(D): A POS
Antibody Screen: NEGATIVE

## 2019-07-11 NOTE — H&P (Addendum)
Leslie [name] is a 33 y.o. female G41 P3002 twin gestation (di-di) @ 9 3/7 weeks by embryo transfer scheduled for repeat cesarean section due to h/o classical cesarean section.  Pt denies contractions, bleeding or lower abdominal pain.  Both babies are moving well. Pt has a death of her last child at 56 months of age due to a mitochondrial disorder.  Pt has a previous h/o vertical/classical cesarean section with myomectomy.  PNC with [facility] Simona Huh) has been complicated by twin gestation with discordance at times as high as 25%.  BPPs and dopplers have been reassuring. OB History    Gravida  4   Para  3   Term  2   Preterm  1   AB      Living  2     SAB      TAB      Ectopic      Multiple  0   Live Births  2          Past Medical History:  Diagnosis Date  . Headache   . Migraines   . Umbilical hernia    Past Surgical History:  Procedure Laterality Date  . CESAREAN SECTION    . CESAREAN SECTION WITH BILATERAL TUBAL LIGATION N/A 11/17/2016   Procedure: CESAREAN SECTION WITH BILATERAL TUBAL LIGATION;  Surgeon: Janyth Pupa, DO;  Location: [place];  Service: Obstetrics;  Laterality: N/A;   Family History: family history includes Autism in her brother; Diabetes in her paternal grandfather; Hypertension in her father and mother; Kidney disease in her father, paternal aunt, and paternal grandfather. Social History:  reports that she has never smoked. She has never used smokeless tobacco. She reports that she does not drink alcohol or use drugs.     Maternal Diabetes: No Genetic Screening: Normal Maternal Ultrasounds/Referrals: Other:Twin gestation with discordant growth Fetal Ultrasounds or other Referrals:  Referred to Materal Fetal Medicine  Maternal Substance Abuse:  No Significant Maternal Medications:  None Significant Maternal Lab Results:  None Other Comments:  Previous death of 85 month old due to mitchondrial disorder  Review of Systems   Constitutional: Negative for chills and fever.  Gastrointestinal: Negative for abdominal pain.   Maternal Medical History:  Fetal activity: Perceived fetal activity is normal.    Prenatal Complications - Diabetes: none.      unknown if currently breastfeeding. Maternal Exam:  Uterine Assessment: Contraction frequency is rare.   Abdomen: Patient reports no abdominal tenderness. Fetal presentation: vertex  Cervix: not evaluated.   Fetal Exam Fetal Monitor Review: Mode: ultrasound.   Baseline rate: 07/09/19 147/138.      Physical Exam  Constitutional: She is oriented to person, place, and time. She appears well-developed and well-nourished. No distress.  HENT:  Head: Normocephalic and atraumatic.  Eyes: EOM are normal.  Neck: Normal range of motion.  Cardiovascular: Normal rate and regular rhythm.  Murmur heard. Respiratory: Effort normal and breath sounds normal. No respiratory distress.  GI: There is no abdominal tenderness.  Musculoskeletal: Normal range of motion.        General: Edema present. No tenderness.  Neurological: She is alert and oriented to person, place, and time.  Skin: Skin is warm and dry.  Psychiatric: She has a normal mood and affect.    Prenatal labs: ABO, Rh: --/--/A POS, A POS Performed at [facility], [place] [address] [place], [place] [address]  [phone]/23 0915) Antibody: NEG (09/23 0915) Rubella: Immune (03/19 0000) RPR: NON REACTIVE (  09/23 0842)  HBsAg: Negative (03/19 0000)  HIV: Non-reactive (03/19 0000)  GBS:   Deferred  Assessment/Plan: Twin Gestation @ 35 3/7 weeks H/o classical cesarean section.  Repeat c-section @ 36 weeks.    Complete BMZ administration prior to delivery. Discordant growth.  BPP/Dopplers tomorrow.  Encouraged fetal kick counts. Umbilical hernia  Repair per general surgery remote from postpartum.   Geryl Rankins 07/11/2019, 10:25 PM

## 2019-07-12 ENCOUNTER — Inpatient Hospital Stay (HOSPITAL_COMMUNITY): Payer: No Typology Code available for payment source

## 2019-07-12 ENCOUNTER — Other Ambulatory Visit: Payer: Self-pay

## 2019-07-12 ENCOUNTER — Encounter (HOSPITAL_COMMUNITY)
Admission: AD | Disposition: A | Discharge status: Home / Self Care | Payer: Self-pay | Source: Home / Self Care | Attending: Obstetrics and Gynecology

## 2019-07-12 ENCOUNTER — Encounter (HOSPITAL_COMMUNITY): Payer: Self-pay

## 2019-07-12 ENCOUNTER — Inpatient Hospital Stay (HOSPITAL_COMMUNITY)
Admission: AD | Admit: 2019-07-12 | Discharge: 2019-07-15 | DRG: 788 | Disposition: A | Discharge status: Home / Self Care | Payer: No Typology Code available for payment source | Attending: Obstetrics and Gynecology | Admitting: Obstetrics and Gynecology

## 2019-07-12 DIAGNOSIS — O9962 Diseases of the digestive system complicating childbirth: Secondary | ICD-10-CM | POA: Diagnosis present

## 2019-07-12 DIAGNOSIS — K429 Umbilical hernia without obstruction or gangrene: Secondary | ICD-10-CM | POA: Diagnosis present

## 2019-07-12 DIAGNOSIS — Z3A35 35 weeks gestation of pregnancy: Secondary | ICD-10-CM

## 2019-07-12 DIAGNOSIS — O34219 Maternal care for unspecified type scar from previous cesarean delivery: Secondary | ICD-10-CM

## 2019-07-12 DIAGNOSIS — O34212 Maternal care for vertical scar from previous cesarean delivery: Secondary | ICD-10-CM | POA: Diagnosis present

## 2019-07-12 DIAGNOSIS — O30043 Twin pregnancy, dichorionic/diamniotic, third trimester: Secondary | ICD-10-CM | POA: Diagnosis present

## 2019-07-12 DIAGNOSIS — Z98891 History of uterine scar from previous surgery: Secondary | ICD-10-CM

## 2019-07-12 DIAGNOSIS — Z20828 Contact with and (suspected) exposure to other viral communicable diseases: Secondary | ICD-10-CM | POA: Diagnosis present

## 2019-07-12 LAB — CBC
HCT: 36.1 % (ref 36.0–46.0)
Hemoglobin: 12.1 g/dL (ref 12.0–15.0)
MCH: 31.2 pg (ref 26.0–34.0)
MCHC: 33.5 g/dL (ref 30.0–36.0)
MCV: 93 fL (ref 80.0–100.0)
Platelets: 125 10*3/uL — ABNORMAL LOW (ref 150–400)
RBC: 3.88 MIL/uL (ref 3.87–5.11)
RDW: 15.4 % (ref 11.5–15.5)
WBC: 8.9 10*3/uL (ref 4.0–10.5)
nRBC: 0.3 % — ABNORMAL HIGH (ref 0.0–0.2)

## 2019-07-12 LAB — CREATININE, SERUM
Creatinine, Ser: 0.56 mg/dL (ref 0.44–1.00)
GFR calc Af Amer: >60 mL/min (ref >60)
GFR calc non Af Amer: >60 mL/min (ref >60)

## 2019-07-12 SURGERY — Surgical Case
Anesthesia: Spinal

## 2019-07-12 MED ORDER — HYDROMORPHONE HCL 1 MG/ML IJ SOLN: 0.2500 mg | INTRAMUSCULAR | Status: DC | PRN

## 2019-07-12 MED ORDER — DIPHENHYDRAMINE HCL 25 MG PO CAPS: 25.0000 mg | ORAL_CAPSULE | Freq: Four times a day (QID) | ORAL | Status: DC | PRN

## 2019-07-12 MED ORDER — METHYLERGONOVINE MALEATE 0.2 MG/ML IJ SOLN: 0.2000 mg | INTRAMUSCULAR | Status: DC | PRN

## 2019-07-12 MED ORDER — SODIUM CHLORIDE 0.9 % IV SOLN
INTRAVENOUS | Status: DC | PRN
  Administered 2019-07-12: 08:00:00 60 ug/min via INTRAVENOUS

## 2019-07-12 MED ORDER — PHENYLEPHRINE HCL-NACL 20-0.9 MG/250ML-% IV SOLN
INTRAVENOUS | Status: AC
  Filled 2019-07-12: qty 250

## 2019-07-12 MED ORDER — PROPOFOL 10 MG/ML IV BOLUS
INTRAVENOUS | Status: DC | PRN
  Administered 2019-07-12 (×4): 5 mg via INTRAVENOUS

## 2019-07-12 MED ORDER — PROPOFOL 10 MG/ML IV BOLUS
INTRAVENOUS | Status: AC
  Filled 2019-07-12: qty 20

## 2019-07-12 MED ORDER — DEXAMETHASONE SODIUM PHOSPHATE 4 MG/ML IJ SOLN
INTRAMUSCULAR | Status: AC
  Filled 2019-07-12: qty 1

## 2019-07-12 MED ORDER — SENNOSIDES-DOCUSATE SODIUM 8.6-50 MG PO TABS
2.0000 | ORAL_TABLET | ORAL | Status: DC
  Administered 2019-07-12 – 2019-07-15 (×3): 2 via ORAL
  Filled 2019-07-12 (×3): qty 2

## 2019-07-12 MED ORDER — OXYCODONE HCL 5 MG PO TABS: 5.0000 mg | ORAL_TABLET | Freq: Once | ORAL | Status: DC | PRN

## 2019-07-12 MED ORDER — NALOXONE HCL 4 MG/10ML IJ SOLN
1.0000 ug/kg/h | INTRAVENOUS | Status: DC | PRN
  Filled 2019-07-12: qty 5

## 2019-07-12 MED ORDER — MORPHINE SULFATE (PF) 0.5 MG/ML IJ SOLN
INTRAMUSCULAR | Status: DC | PRN
  Administered 2019-07-12: 150 ug via INTRATHECAL

## 2019-07-12 MED ORDER — DIPHENHYDRAMINE HCL 25 MG PO CAPS: 25.0000 mg | ORAL_CAPSULE | ORAL | Status: DC | PRN

## 2019-07-12 MED ORDER — DEXTROSE 5 % IV SOLN
INTRAVENOUS | Status: DC | PRN
  Administered 2019-07-12: 3 g via INTRAVENOUS

## 2019-07-12 MED ORDER — DIBUCAINE (PERIANAL) 1 % EX OINT: 1.0000 "application " | TOPICAL_OINTMENT | CUTANEOUS | Status: DC | PRN

## 2019-07-12 MED ORDER — INFLUENZA VAC SPLIT QUAD 0.5 ML IM SUSY: 0.5000 mL | PREFILLED_SYRINGE | INTRAMUSCULAR | Status: DC

## 2019-07-12 MED ORDER — OXYTOCIN 10 UNIT/ML IJ SOLN
INTRAMUSCULAR | Status: DC | PRN
  Administered 2019-07-12: 40 [IU]

## 2019-07-12 MED ORDER — ONDANSETRON HCL 4 MG/2ML IJ SOLN
INTRAMUSCULAR | Status: AC
  Filled 2019-07-12: qty 2

## 2019-07-12 MED ORDER — ENOXAPARIN SODIUM 80 MG/0.8ML ~~LOC~~ SOLN
0.5000 mg/kg | SUBCUTANEOUS | Status: DC
  Administered 2019-07-13: 65 mg via SUBCUTANEOUS
  Filled 2019-07-12 (×2): qty 0.8

## 2019-07-12 MED ORDER — SIMETHICONE 80 MG PO CHEW
80.0000 mg | CHEWABLE_TABLET | ORAL | Status: DC
  Administered 2019-07-12 – 2019-07-15 (×3): 80 mg via ORAL
  Filled 2019-07-12 (×3): qty 1

## 2019-07-12 MED ORDER — MENTHOL 3 MG MT LOZG: 1.0000 | LOZENGE | OROMUCOSAL | Status: DC | PRN

## 2019-07-12 MED ORDER — TETANUS-DIPHTH-ACELL PERTUSSIS 5-2.5-18.5 LF-MCG/0.5 IM SUSP: 0.5000 mL | Freq: Once | INTRAMUSCULAR | Status: DC

## 2019-07-12 MED ORDER — DIPHENHYDRAMINE HCL 50 MG/ML IJ SOLN: 12.5000 mg | INTRAMUSCULAR | Status: DC | PRN

## 2019-07-12 MED ORDER — METHYLERGONOVINE MALEATE 0.2 MG PO TABS: 0.2000 mg | ORAL_TABLET | ORAL | Status: DC | PRN

## 2019-07-12 MED ORDER — LACTATED RINGERS IV SOLN
INTRAVENOUS | Status: DC | PRN
  Administered 2019-07-12 (×2): via INTRAVENOUS

## 2019-07-12 MED ORDER — NALBUPHINE HCL 10 MG/ML IJ SOLN
5.0000 mg | INTRAMUSCULAR | Status: DC | PRN
  Filled 2019-07-12: qty 0.5

## 2019-07-12 MED ORDER — BUPIVACAINE IN DEXTROSE 0.75-8.25 % IT SOLN
INTRATHECAL | Status: DC | PRN
  Administered 2019-07-12: 1.6 mL via INTRATHECAL

## 2019-07-12 MED ORDER — ONDANSETRON HCL 4 MG/2ML IJ SOLN
INTRAMUSCULAR | Status: DC | PRN
  Administered 2019-07-12: 4 mg via INTRAVENOUS

## 2019-07-12 MED ORDER — KETOROLAC TROMETHAMINE 30 MG/ML IJ SOLN: 30.0000 mg | Freq: Once | INTRAMUSCULAR | Status: DC | PRN

## 2019-07-12 MED ORDER — DEXTROSE 5 % IV SOLN
INTRAVENOUS | Status: AC
  Filled 2019-07-12: qty 3000

## 2019-07-12 MED ORDER — SCOPOLAMINE 1 MG/3DAYS TD PT72
MEDICATED_PATCH | TRANSDERMAL | Status: DC | PRN
  Administered 2019-07-12: 1 via TRANSDERMAL

## 2019-07-12 MED ORDER — PROMETHAZINE HCL 25 MG/ML IJ SOLN
6.2500 mg | INTRAMUSCULAR | Status: AC | PRN
  Administered 2019-07-12: 10:00:00 6.25 mg via INTRAVENOUS
  Administered 2019-07-12: 10:00:00 12.5 mg via INTRAVENOUS

## 2019-07-12 MED ORDER — FERROUS SULFATE 325 (65 FE) MG PO TABS
325.0000 mg | ORAL_TABLET | Freq: Two times a day (BID) | ORAL | Status: DC
  Administered 2019-07-13 – 2019-07-15 (×5): 325 mg via ORAL
  Filled 2019-07-12 (×5): qty 1

## 2019-07-12 MED ORDER — OXYCODONE HCL 5 MG/5ML PO SOLN: 5.0000 mg | Freq: Once | ORAL | Status: DC | PRN

## 2019-07-12 MED ORDER — ZOLPIDEM TARTRATE 5 MG PO TABS: 5.0000 mg | ORAL_TABLET | Freq: Every evening | ORAL | Status: DC | PRN

## 2019-07-12 MED ORDER — METOCLOPRAMIDE HCL 5 MG/ML IJ SOLN
INTRAMUSCULAR | Status: DC | PRN
  Administered 2019-07-12: 10 mg via INTRAVENOUS

## 2019-07-12 MED ORDER — ACETAMINOPHEN 500 MG PO TABS
1000.0000 mg | ORAL_TABLET | Freq: Four times a day (QID) | ORAL | Status: DC
  Administered 2019-07-12 – 2019-07-15 (×11): 1000 mg via ORAL
  Filled 2019-07-12 (×11): qty 2

## 2019-07-12 MED ORDER — DEXTROSE 5 % IV SOLN: 3.0000 g | INTRAVENOUS | Status: DC

## 2019-07-12 MED ORDER — PROMETHAZINE HCL 25 MG/ML IJ SOLN
INTRAMUSCULAR | Status: AC
  Filled 2019-07-12: qty 1

## 2019-07-12 MED ORDER — OXYCODONE HCL 5 MG PO TABS
5.0000 mg | ORAL_TABLET | ORAL | Status: DC | PRN
  Administered 2019-07-13: 10:00:00 5 mg via ORAL
  Administered 2019-07-13: 10 mg via ORAL
  Administered 2019-07-13: 16:00:00 5 mg via ORAL
  Filled 2019-07-12 (×2): qty 1
  Filled 2019-07-12: qty 2

## 2019-07-12 MED ORDER — NALOXONE HCL 0.4 MG/ML IJ SOLN: 0.4000 mg | INTRAMUSCULAR | Status: DC | PRN

## 2019-07-12 MED ORDER — LACTATED RINGERS IV SOLN
INTRAVENOUS | Status: DC
  Administered 2019-07-12 (×2): via INTRAVENOUS

## 2019-07-12 MED ORDER — LACTATED RINGERS IV SOLN
INTRAVENOUS | Status: DC
  Administered 2019-07-12: 14:00:00 via INTRAVENOUS

## 2019-07-12 MED ORDER — FENTANYL CITRATE (PF) 100 MCG/2ML IJ SOLN
INTRAMUSCULAR | Status: DC | PRN
  Administered 2019-07-12: 15 ug via INTRATHECAL

## 2019-07-12 MED ORDER — NALBUPHINE HCL 10 MG/ML IJ SOLN
5.0000 mg | Freq: Once | INTRAMUSCULAR | Status: DC | PRN
  Filled 2019-07-12: qty 0.5

## 2019-07-12 MED ORDER — DEXAMETHASONE SODIUM PHOSPHATE 4 MG/ML IJ SOLN
INTRAMUSCULAR | Status: DC | PRN
  Administered 2019-07-12: 4 mg via INTRAVENOUS

## 2019-07-12 MED ORDER — COCONUT OIL OIL: 1.0000 "application " | TOPICAL_OIL | Status: DC | PRN

## 2019-07-12 MED ORDER — OXYTOCIN 40 UNITS IN NORMAL SALINE INFUSION - SIMPLE MED: 2.5000 [IU]/h | INTRAVENOUS | Status: AC

## 2019-07-12 MED ORDER — MORPHINE SULFATE (PF) 0.5 MG/ML IJ SOLN
INTRAMUSCULAR | Status: AC
  Filled 2019-07-12: qty 10

## 2019-07-12 MED ORDER — SCOPOLAMINE 1 MG/3DAYS TD PT72: 1.0000 | MEDICATED_PATCH | Freq: Once | TRANSDERMAL | Status: DC

## 2019-07-12 MED ORDER — SODIUM CHLORIDE 0.9 % IV SOLN
INTRAVENOUS | Status: DC | PRN
  Administered 2019-07-12: 08:00:00 via INTRAVENOUS

## 2019-07-12 MED ORDER — SIMETHICONE 80 MG PO CHEW
80.0000 mg | CHEWABLE_TABLET | Freq: Three times a day (TID) | ORAL | Status: DC
  Administered 2019-07-12 – 2019-07-15 (×7): 80 mg via ORAL
  Filled 2019-07-12 (×7): qty 1

## 2019-07-12 MED ORDER — SODIUM CHLORIDE 0.9 % IR SOLN
Status: DC | PRN
  Administered 2019-07-12: 1

## 2019-07-12 MED ORDER — MEPERIDINE HCL 25 MG/ML IJ SOLN: 6.2500 mg | INTRAMUSCULAR | Status: DC | PRN

## 2019-07-12 MED ORDER — WITCH HAZEL-GLYCERIN EX PADS: 1.0000 "application " | MEDICATED_PAD | CUTANEOUS | Status: DC | PRN

## 2019-07-12 MED ORDER — PRENATAL MULTIVITAMIN CH
1.0000 | ORAL_TABLET | Freq: Every day | ORAL | Status: DC
  Administered 2019-07-13 – 2019-07-14 (×2): 1 via ORAL
  Filled 2019-07-12 (×2): qty 1

## 2019-07-12 MED ORDER — METOCLOPRAMIDE HCL 5 MG/ML IJ SOLN
INTRAMUSCULAR | Status: AC
  Filled 2019-07-12: qty 2

## 2019-07-12 MED ORDER — FENTANYL CITRATE (PF) 100 MCG/2ML IJ SOLN
INTRAMUSCULAR | Status: AC
  Filled 2019-07-12: qty 2

## 2019-07-12 MED ORDER — OXYTOCIN 40 UNITS IN NORMAL SALINE INFUSION - SIMPLE MED
INTRAVENOUS | Status: AC
  Filled 2019-07-12: qty 1000

## 2019-07-12 MED ORDER — HYDROMORPHONE HCL 1 MG/ML IJ SOLN: 0.2000 mg | INTRAMUSCULAR | Status: DC | PRN

## 2019-07-12 MED ORDER — SODIUM CHLORIDE 0.9% FLUSH: 3.0000 mL | INTRAVENOUS | Status: DC | PRN

## 2019-07-12 MED ORDER — ONDANSETRON HCL 4 MG/2ML IJ SOLN: 4.0000 mg | Freq: Three times a day (TID) | INTRAMUSCULAR | Status: DC | PRN

## 2019-07-12 MED ORDER — SIMETHICONE 80 MG PO CHEW
80.0000 mg | CHEWABLE_TABLET | ORAL | Status: DC | PRN
  Filled 2019-07-12 (×2): qty 1

## 2019-07-12 SURGICAL SUPPLY — 38 items
BARRIER ADHS 3X4 INTERCEED (GAUZE/BANDAGES/DRESSINGS) ×3 IMPLANT
BENZOIN TINCTURE PRP APPL 2/3 (GAUZE/BANDAGES/DRESSINGS) ×2 IMPLANT
CHLORAPREP W/TINT 26ML (MISCELLANEOUS) ×3 IMPLANT
CLAMP CORD UMBIL (MISCELLANEOUS) IMPLANT
CLOSURE WOUND 1/2 X4 (GAUZE/BANDAGES/DRESSINGS) ×1
CLOTH BEACON ORANGE TIMEOUT ST (SAFETY) ×3 IMPLANT
DERMABOND ADVANCED (GAUZE/BANDAGES/DRESSINGS)
DERMABOND ADVANCED .7 DNX12 (GAUZE/BANDAGES/DRESSINGS) IMPLANT
DRESSING PREVENA PLUS CUSTOM (GAUZE/BANDAGES/DRESSINGS) IMPLANT
DRSG OPSITE POSTOP 4X10 (GAUZE/BANDAGES/DRESSINGS) ×3 IMPLANT
DRSG PREVENA PLUS CUSTOM (GAUZE/BANDAGES/DRESSINGS) ×3
ELECT REM PT RETURN 9FT ADLT (ELECTROSURGICAL) ×3
ELECTRODE REM PT RTRN 9FT ADLT (ELECTROSURGICAL) ×1 IMPLANT
EXTRACTOR VACUUM BELL STYLE (SUCTIONS) IMPLANT
GLOVE BIO SURGEON STRL SZ7 (GLOVE) ×3 IMPLANT
GLOVE BIOGEL PI IND STRL 7.0 (GLOVE) ×2 IMPLANT
GLOVE BIOGEL PI INDICATOR 7.0 (GLOVE) ×4
GOWN STRL REUS W/TWL LRG LVL3 (GOWN DISPOSABLE) ×6 IMPLANT
KIT ABG SYR 3ML LUER SLIP (SYRINGE) IMPLANT
NDL HYPO 25X5/8 SAFETYGLIDE (NEEDLE) IMPLANT
NEEDLE HYPO 25X5/8 SAFETYGLIDE (NEEDLE) IMPLANT
NS IRRIG 1000ML POUR BTL (IV SOLUTION) ×3 IMPLANT
PACK C SECTION WH (CUSTOM PROCEDURE TRAY) ×3 IMPLANT
PAD OB MATERNITY 4.3X12.25 (PERSONAL CARE ITEMS) ×3 IMPLANT
PENCIL SMOKE EVAC W/HOLSTER (ELECTROSURGICAL) ×3 IMPLANT
RTRCTR C-SECT PINK 25CM LRG (MISCELLANEOUS) ×3 IMPLANT
STRIP CLOSURE SKIN 1/2X4 (GAUZE/BANDAGES/DRESSINGS) ×1 IMPLANT
SUT CHROMIC 0 CTX 36 (SUTURE) IMPLANT
SUT MON AB 4-0 PS1 27 (SUTURE) ×3 IMPLANT
SUT PLAIN 0 NONE (SUTURE) IMPLANT
SUT PLAIN 2 0 XLH (SUTURE) ×3 IMPLANT
SUT VIC AB 0 CTX 36 (SUTURE) ×10
SUT VIC AB 0 CTX36XBRD ANBCTRL (SUTURE) ×5 IMPLANT
SUT VIC AB 2-0 CT1 27 (SUTURE) ×2
SUT VIC AB 2-0 CT1 TAPERPNT 27 (SUTURE) ×1 IMPLANT
TOWEL OR 17X24 6PK STRL BLUE (TOWEL DISPOSABLE) ×3 IMPLANT
TRAY FOLEY W/BAG SLVR 14FR LF (SET/KITS/TRAYS/PACK) IMPLANT
WATER STERILE IRR 1000ML POUR (IV SOLUTION) ×3 IMPLANT

## 2019-07-12 NOTE — Interval H&P Note (Signed)
History and Physical Interval Note:  07/12/2019 7:05 AM  Leslie [name]  has presented today for surgery, with the diagnosis of Z98.891 History of cesarean section.  The various methods of treatment have been discussed with the patient and family. After consideration of risks, benefits and other options for treatment, the patient has consented to  Procedure(s): [place] (N/A) as a surgical intervention.  The patient's history has been reviewed, patient examined, no change in status, stable for surgery.  I have reviewed the patient's chart and labs.  Questions were answered to the patient's satisfaction.     Leslie [name]

## 2019-07-12 NOTE — Anesthesia Procedure Notes (Signed)
Spinal  Patient location during procedure: OB Start time: 07/12/2019 7:32 AM End time: 07/12/2019 7:37 AM Staffing Anesthesiologist: Lynda Rainwater, MD Performed: anesthesiologist  Preanesthetic Checklist Completed: patient identified, surgical consent, pre-op evaluation, timeout performed, IV checked, risks and benefits discussed and monitors and equipment checked Spinal Block Patient position: sitting Prep: site prepped and draped and DuraPrep Patient monitoring: heart rate, cardiac monitor, continuous pulse ox and blood pressure Approach: midline Location: L3-4 Injection technique: single-shot Needle Needle type: Pencan  Needle gauge: 24 G Needle length: 10 cm Assessment Sensory level: T4

## 2019-07-12 NOTE — Anesthesia Postprocedure Evaluation (Signed)
Anesthesia Post Note  Patient: Laurisa Sahakian  Procedure(s) Performed: CESAREAN SECTION MULTI-GESTATIONAL (N/A )     Patient location during evaluation: PACU Anesthesia Type: Spinal Level of consciousness: oriented and awake and alert Pain management: pain level controlled Vital Signs Assessment: post-procedure vital signs reviewed and stable Respiratory status: spontaneous breathing and respiratory function stable Cardiovascular status: blood pressure returned to baseline and stable Postop Assessment: no headache, no backache and no apparent nausea or vomiting Anesthetic complications: no    Last Vitals:  Vitals:   07/12/19 1015 07/12/19 1029  BP: 132/75 136/78  Pulse: 65 65  Resp: 18 20  Temp:  36.7 C  SpO2: 94% 98%    Last Pain:  Vitals:   07/12/19 1029  TempSrc: Oral   Pain Goal:    LLE Motor Response: Purposeful movement (07/12/19 1015) LLE Sensation: Tingling (07/12/19 1015) RLE Motor Response: Purposeful movement (07/12/19 1015) RLE Sensation: Tingling (07/12/19 1015)     Epidural/Spinal Function Cutaneous sensation: Tingles (07/12/19 1015), Patient able to flex knees: Yes (07/12/19 1015), Patient able to lift hips off bed: Yes (07/12/19 1015), Back pain beyond tenderness at insertion site: No (07/12/19 1015), Progressively worsening motor and/or sensory loss: No (07/12/19 1015), Bowel and/or bladder incontinence post epidural: No (07/12/19 1015)  Lynda Rainwater

## 2019-07-12 NOTE — Brief Op Note (Signed)
07/12/2019  9:07 AM  PATIENT:  Leslie [name]  33 y.o. female  PRE-OPERATIVE DIAGNOSIS:  IUP @ 36 0/7 Z98.891 History of classical cesarean section, Twin gestation  POST-OPERATIVE DIAGNOSIS:  * No post-op diagnosis entered *  PROCEDURE:  Procedure(s): CESAREAN SECTION MULTI-GESTATIONAL (N/A), Repeat LTCS  SURGEON:  Surgeon(s) and Role:    Thurnell Lose, MD - Primary    * Janyth Pupa, DO - Assisting  PHYSICIAN ASSISTANT:   ASSISTANTS: Dr. Nelda Marseille and technicians   ANESTHESIA:   spinal  EBL:  532   BLOOD ADMINISTERED:none  DRAINS: none and Urinary Catheter (Foley)   LOCAL MEDICATIONS USED:  NONE  SPECIMEN:  Source of Specimen:  Placenta  DISPOSITION OF SPECIMEN:  PATHOLOGY  COUNTS:  YES  TOURNIQUET:  * No tourniquets in log *  DICTATION: .Other Dictation: Dictation Number [id number]  PLAN OF CARE: Admit to inpatient   PATIENT DISPOSITION:  PACU - hemodynamically stable.   Delay start of Pharmacological VTE agent (>24hrs) due to surgical blood loss or risk of bleeding: no

## 2019-07-12 NOTE — Lactation Note (Signed)
This note was copied from a baby's chart. Lactation Consultation Note  Patient Name: Leslie [name] Date: 07/12/2019 Reason for consult: Initial assessment;Late-preterm 34-36.6wks  1933 - 56 - I conducted an initial consult with Leslie [name], and experienced multip who states that she had ample milk with her first kids. Both babies, Roderic Palau and [place], were sleeping in their bassinets upon entry.  Leslie [name] was pumping using her Medela personal pump upon entry. She had pumped 2 ounces with this pump. I labelled her storage bottle and noted two additional storage bottles with milk in the refrigerator. Her milk appears to be mature milk in it's constitution.  Leslie [name] would like to breast feed her babies. She states that they have had difficulty latching, and attributes that to having larger nipples. Her nipples appeared everted, large, and WNL. I offered to return to help her latch babies when ready. She indicated that she would call us.  Babies just ate about one hour prior to entry. She has been giving them between 10-15 mls by bottle every 2-3 hours. She states that babies take bottles well and are waking and cueing when ready to feed. We discussed day 1 and day 2 infant feeding volumes and paced bottle feeding.  I recommended that the continue to pump every three hours and to feed babies breast or bottle at least 8-12 times/day.   Maternal Data Has patient been taught Hand Expression?: Yes Does the patient have breastfeeding experience prior to this delivery?: Yes  Feeding Feeding Type: Breast Milk  Interventions  Personal Pump  Lactation Tools Discussed/Used Pump Review: Setup, frequency, and cleaning;Milk Storage   Consult Status Consult Status: Follow-up Date: 07/13/19 Follow-up type: In-patient    Lenore Manner 07/12/2019, 7:49 PM

## 2019-07-12 NOTE — Transfer of Care (Signed)
Immediate Anesthesia Transfer of Care Note  Patient: Leslie [name]  Procedure(s) Performed: CESAREAN SECTION MULTI-GESTATIONAL (N/A )  Patient Location: PACU  Anesthesia Type:Spinal  Level of Consciousness: awake, alert  and oriented  Airway & Oxygen Therapy: Patient Spontanous Breathing  Post-op Assessment: Report given to RN and Post -op Vital signs reviewed and stable  Post vital signs: Reviewed and stable  Last Vitals:  Vitals Value Taken Time  BP 139/82 07/12/19 0921  Temp    Pulse 68 07/12/19 0922  Resp 17 07/12/19 0922  SpO2 92 % 07/12/19 0922  Vitals shown include unvalidated device data.  Last Pain:  Vitals:   07/12/19 0547  TempSrc: Oral         Complications: No apparent anesthesia complications

## 2019-07-12 NOTE — Lactation Note (Signed)
This note was copied from a baby's chart. Lactation Consultation Note  Patient Name: Leslie [name] [id number] Date: 07/12/2019 Reason for consult: Follow-up assessment;Mother's request;Difficult latch;Late-preterm 34-36.6wks;Multiple gestation  Ms. Firkus paged because her son, Leslie [name], was beginning to cue, and she wanted to practice latching him. I helped her attempt to latch first in cross cradle hold and then in football hold. Baby opened his mouth a few time over the nipple, but then would close mouth into a smacking motion. He appeared to be tongue sucking. Leslie [name] was also sleepy and not fully committed to latch. He tired out after a few minutes.   Ms. Moller's support person then prepared a bottle to feed baby.  I educated on developmental readiness to breast feed, and encouraged Ms. Lockamy to hold baby skin to skin and to practice lick and learn. We worked on positioning as well. I encouraged her to keep trying when baby appeared alert and was cueing. I encouraged lactation follow up tomorrow. She will continue with the plan we stated in earlier visit.   LATCH Score Latch: Repeated attempts needed to sustain latch, nipple held in mouth throughout feeding, stimulation needed to elicit sucking reflex.  Audible Swallowing: None  Type of Nipple: Everted at rest and after stimulation  Comfort (Breast/Nipple): Soft / non-tender  Hold (Positioning): Assistance needed to correctly position infant at breast and maintain latch.  LATCH Score: 6  Interventions Interventions: Breast feeding basics reviewed;Assisted with latch;Hand express;Breast compression;Adjust position;Support pillows;Position options  Consult Status Consult Status: Follow-up Date: 07/13/19 Follow-up type: In-patient    Lenore Manner 07/12/2019, 10:01 PM

## 2019-07-12 NOTE — Anesthesia Preprocedure Evaluation (Signed)
Anesthesia Evaluation  Patient identified by MRN, date of birth, ID band Patient awake    Reviewed: Allergy & Precautions, H&P , NPO status , Patient's Chart, lab work & pertinent test results  Airway Mallampati: II  TM Distance: >3 FB Neck ROM: full    Dental no notable dental hx.    Pulmonary neg pulmonary ROS,    Pulmonary exam normal breath sounds clear to auscultation       Cardiovascular negative cardio ROS Normal cardiovascular exam Rhythm:regular Rate:Normal     Neuro/Psych  Headaches,    GI/Hepatic   Endo/Other  Morbid obesityobese  Renal/GU      Musculoskeletal   Abdominal (+) + obese,   Peds  Hematology   Anesthesia Other Findings   Reproductive/Obstetrics (+) Pregnancy IUP.                             Anesthesia Physical  Anesthesia Plan  ASA: III  Anesthesia Plan: Spinal   Post-op Pain Management:    Induction:   PONV Risk Score and Plan: 2 and Treatment may vary due to age or medical condition  Airway Management Planned: Natural Airway  Additional Equipment:   Intra-op Plan:   Post-operative Plan:   Informed Consent: I have reviewed the patients History and Physical, chart, labs and discussed the procedure including the risks, benefits and alternatives for the proposed anesthesia with the patient or authorized representative who has indicated his/her understanding and acceptance.       Plan Discussed with: CRNA, Anesthesiologist and Surgeon  Anesthesia Plan Comments:         Anesthesia Quick Evaluation

## 2019-07-12 NOTE — Op Note (Signed)
Leslie [name], Leslie [name] MEDICAL RECORD [id number] ACCOUNT [id number] DATE OF BIRTH:08-14-1986 FACILITY: [facility] LOCATION: [id number] PHYSICIAN:Tuleen Mandelbaum Derrell Lolling, MD  OPERATIVE REPORT  DATE OF PROCEDURE:  07/12/2019  PREOPERATIVE DIAGNOSES: 1.  Intrauterine pregnancy at 36-0/7 weeks. 2.  History of classical cesarean section and twin gestation  PREOPERATIVE DIAGNOSES: 1.  Intrauterine pregnancy at 36-0/7 weeks. 2.  History of classical cesarean section and twin gestation.  PROCEDURE:  Repeat low transverse cesarean section.  SURGEON:  Geryl Rankins, MD  ASSISTANT:  Myna Hidalgo, DO, and technicians.  ANESTHESIA:  Spinal.  ESTIMATED BLOOD LOSS:  744 mL.  BLOOD ADMINISTERED:  None.  DRAINS:  Foley catheter.  LOCAL:  None.  SPECIMENS:  Placenta to pathology.  PATIENT DISPOSITION:  To PACU hemodynamically stable.  FINDINGS:  Viable female in the vertex position as baby A, nuchal cord x1.  Viable female baby in the vertex position as baby B.  Clear fluid for both.  Both cry spontaneously.  Delayed cord clamping performed.  Normal uterus with thin lower uterine  segment.  Fallopian tubes surgically absent bilaterally and ovaries normal bilaterally.  Large umbilical hernia palpated and diastasis of the rectus is severe.  DESCRIPTION OF PROCEDURE:  The patient was identified in the holding area.  She was then taken to the operating room with IV running.  She underwent spinal anesthesia without complication.  She was placed in the dorsal supine position.  A traxi was  placed.  She was then prepped and Foley catheter was placed.  SCDs were on her legs and operating.  Ancef 2 g IV was administered prior to the start of the procedure.  Abdomen was also marked prior to placing the drape on.  A Pfannenstiel skin incision was then made with the scalpel and carried down to the underlying layer of the fascia with the Bovie.  Fascia was incised at the midline and was extended laterally.    Rectus muscles were dissected sharply with the Mayo scissors on the lower portion and upper portion.  The fascia was very dense and thick at the midline.  That was separated from the rectus muscles with the scalpel.  The peritoneum was entered during  that process.  It was separated.  Then with the Bovie, abdomen was stretched.  Of note, the patient's abdomen was very floppy due to the gravid uterus.  It would naturally hang left, and we had to push it midline.  The Alexis retractor was eventually  inserted.  We made a bladder flap.  The lower uterine segment was then incised with the scalpel and extended manually.  Clear fluid noted on baby A.  The head was brought to the incision easily.  Nose and mouth were suctioned.  Nuchal cord was manually  reduced prior to delivery.  Delayed cord clamping was performed for her.  Then cord was clamped, and she was handed off to the awaiting NICU team.  The second baby, the amnion was ruptured with the Allis.  Clear fluid noted.  He was easily delivered in  the vertex position as well, and delayed cord clamping was also performed.  His umbilical cord was clamped with a plastic clamp and a Tresa Endo, and he was handed off to the awaiting NICU team.  The second umbilical cord was then attached to the umbilical  cord of baby A.  Attention was then turned to the lower uterine segment.  It was closed with 0 Vicryl in a continuous locked fashion.  Several U stitches were performed for hemostasis.  The lower uterine segment was thin and it appeared to be bulging, but that resolved  with a U stitch.  The gutters were cleared of all clots and debris.  When I was trying to get the fascia off of the rectus, the serosa of the uterus was incised superficially.  That was cauterized with the Bovie.  The patient's rectus muscles were very lateral.  Peritoneum was not well identified due to the history of previous incisions and scarring.  I did do a U stitch on the lower portion of  the rectus muscle, but I was unable to bring the other muscles back  safely.  I then palpated up to the umbilicus, and I felt what felt like a 5 cm umbilical hernia.  Each layer was cauterized and irrigated prior to closure.  Fascia was then closed with 0 Vicryl in a continuous fashion x2.  Subcutaneous layer was closed with 2-0 plain gut, and then the skin was reapproximated with 4-0 Monocryl in a subcuticular  fashion.  The patient was kind of oozy.  She had taken aspirin the night before.   A Prevena was placed due to the decreased elasticity and tone of the abdomen and risk for enlarged pannus.  The patient tolerated the procedure well.  She was taken to the recovery room in stable condition.  LN/NUANCE  D:07/12/2019 T:07/12/2019 JOB:[id number]

## 2019-07-12 NOTE — Unmapped (Signed)
Formatting of this note is different from the original.  Immediate Anesthesia Transfer of Care Note    Patient: Kristy Brown    Procedure(s) Performed: CESAREAN SECTION MULTI-GESTATIONAL (N/A )    Patient Location: PACU    Anesthesia Type:Spinal    Level of Consciousness: awake, alert  and oriented    Airway & Oxygen Therapy: Patient Spontanous Breathing    Post-op Assessment: Report given to RN and Post -op Vital signs reviewed and stable    Post vital signs: Reviewed and stable    Last Vitals:   Vitals Value Taken Time   BP 139/82 07/12/19 0921   Temp     Pulse 68 07/12/19 0922   Resp 17 07/12/19 0922   SpO2 92 % 07/12/19 0922   Vitals shown include unvalidated device data.    Last Pain:   Vitals:    07/12/19 0547   TempSrc: Oral           Complications: No apparent anesthesia complications  Electronically signed by Graciela Husbands, CRNA at 07/12/2019  9:23 AM EDT

## 2019-07-12 NOTE — Anesthesia Pre-Procedure Evaluation (Signed)
Formatting of this note is different from the original.                                    Anesthesia Evaluation   Patient identified by MRN, date of birth, ID band  Patient awake    Reviewed:  Allergy & Precautions, H&P , NPO status , Patient's Chart, lab work & pertinent test results    Airway  Mallampati: II    TM Distance: >3 FB  Neck ROM: full     Dental  no notable dental hx.      Pulmonary  neg pulmonary ROS,     Pulmonary exam normal  breath sounds clear to auscultation     Cardiovascular  negative cardio ROS  Normal cardiovascular exam  Rhythm:regular Rate:Normal      Neuro/Psych   Headaches,     GI/Hepatic    Endo/Other   Morbid obesityobese   Renal/GU        Musculoskeletal     Abdominal  (+) + obese,    Peds   Hematology    Anesthesia Other Findings     Reproductive/Obstetrics  (+) Pregnancy  IUP.              Anesthesia Physical    Anesthesia Plan    ASA: III    Anesthesia Plan: Spinal     Post-op Pain Management:      Induction:     PONV Risk Score and Plan: 2 and Treatment may vary due to age or medical condition    Airway Management Planned: Natural Airway    Additional Equipment:     Intra-op Plan:     Post-operative Plan:     Informed Consent: I have reviewed the patients History and Physical, chart, labs and discussed the procedure including the risks, benefits and alternatives for the proposed anesthesia with the patient or authorized representative who has indicated his/her understanding and acceptance.     Plan Discussed with: CRNA, Anesthesiologist and Surgeon    Anesthesia Plan Comments:      Anesthesia Quick Evaluation    Electronically signed by Lynda Rainwater, MD at 07/12/2019  7:01 AM EDT

## 2019-07-12 NOTE — Anesthesia Post-Procedure Evaluation (Signed)
Formatting of this note is different from the original.   Anesthesia Post Note    Patient: Kristy Brown    Procedure(s) Performed: CESAREAN SECTION MULTI-GESTATIONAL (N/A )        Patient location during evaluation: PACU  Anesthesia Type: Spinal  Level of consciousness: oriented and awake and alert  Pain management: pain level controlled  Vital Signs Assessment: post-procedure vital signs reviewed and stable  Respiratory status: spontaneous breathing and respiratory function stable  Cardiovascular status: blood pressure returned to baseline and stable  Postop Assessment: no headache, no backache and no apparent nausea or vomiting  Anesthetic complications: no    Last Vitals:   Vitals:    07/12/19 1015 07/12/19 1029   BP: 132/75 136/78   Pulse: 65 65   Resp: 18 20   Temp:  36.7 C   SpO2: 94% 98%     Last Pain:   Vitals:    07/12/19 1029   TempSrc: Oral     Pain Goal:      LLE Motor Response: Purposeful movement (07/12/19 1015)  LLE Sensation: Tingling (07/12/19 1015)  RLE Motor Response: Purposeful movement (07/12/19 1015)  RLE Sensation: Tingling (07/12/19 1015)      Epidural/Spinal Function Cutaneous sensation: Tingles (07/12/19 1015), Patient able to flex knees: Yes (07/12/19 1015), Patient able to lift hips off bed: Yes (07/12/19 1015), Back pain beyond tenderness at insertion site: No (07/12/19 1015), Progressively worsening motor and/or sensory loss: No (07/12/19 1015), Bowel and/or bladder incontinence post epidural: No (07/12/19 1015)    Lynda Rainwater      Electronically signed by Lynda Rainwater, MD at 07/12/2019 10:33 AM EDT

## 2019-07-12 NOTE — Anesthesia Procedure Notes (Signed)
Associated Order(s): Spinal  Formatting of this note might be different from the original.  Spinal    Patient location during procedure: OB  Start time: 07/12/2019 7:32 AM  End time: 07/12/2019 7:37 AM  Staffing  Anesthesiologist: Lowella Curb, MD  Performed: anesthesiologist   Preanesthetic Checklist  Completed: patient identified, surgical consent, pre-op evaluation, timeout performed, IV checked, risks and benefits discussed and monitors and equipment checked  Spinal Block  Patient position: sitting  Prep: site prepped and draped and DuraPrep  Patient monitoring: heart rate, cardiac monitor, continuous pulse ox and blood pressure  Approach: midline  Location: L3-4  Injection technique: single-shot  Needle  Needle type: Pencan   Needle gauge: 24 G  Needle length: 10 cm  Assessment  Sensory level: T4      Electronically signed by Lowella Curb, MD at 07/12/2019  7:41 AM EDT

## 2019-07-13 LAB — CBC
HCT: 34.3 % — ABNORMAL LOW (ref 36.0–46.0)
Hemoglobin: 11.1 g/dL — ABNORMAL LOW (ref 12.0–15.0)
MCH: 30.5 pg (ref 26.0–34.0)
MCHC: 32.4 g/dL (ref 30.0–36.0)
MCV: 94.2 fL (ref 80.0–100.0)
Platelets: 125 10*3/uL — ABNORMAL LOW (ref 150–400)
RBC: 3.64 MIL/uL — ABNORMAL LOW (ref 3.87–5.11)
RDW: 15.5 % (ref 11.5–15.5)
WBC: 7.8 10*3/uL (ref 4.0–10.5)
nRBC: 0.3 % — ABNORMAL HIGH (ref 0.0–0.2)

## 2019-07-13 MED ORDER — IBUPROFEN 800 MG PO TABS
800.0000 mg | ORAL_TABLET | Freq: Once | ORAL | Status: AC
  Administered 2019-07-13: 18:00:00 800 mg via ORAL
  Filled 2019-07-13: qty 1

## 2019-07-13 MED ORDER — IBUPROFEN 800 MG PO TABS
800.0000 mg | ORAL_TABLET | Freq: Three times a day (TID) | ORAL | Status: DC
  Administered 2019-07-14 – 2019-07-15 (×5): 800 mg via ORAL
  Filled 2019-07-13 (×5): qty 1

## 2019-07-13 MED ORDER — HYDROMORPHONE HCL 2 MG PO TABS
2.0000 mg | ORAL_TABLET | Freq: Four times a day (QID) | ORAL | Status: DC | PRN
  Administered 2019-07-13 – 2019-07-14 (×2): 2 mg via ORAL
  Filled 2019-07-13 (×2): qty 1

## 2019-07-13 NOTE — Progress Notes (Signed)
Subjective: Postpartum Day 1: Cesarean Delivery Patient reports incisional pain, tolerating PO, + flatus and no problems voiding.    Objective: Vital signs in last 24 hours: Temp:  [98 F (36.7 C)-99.3 F (37.4 C)] 98.6 F (37 C) (09/26 0505) Pulse Rate:  [62-73] 73 (09/26 0505) Resp:  [18] 18 (09/26 0505) BP: (115-134)/(72-83) 128/79 (09/26 0505) SpO2:  [98 %-100 %] 98 % (09/26 0505)  Physical Exam:  General: alert, cooperative and no distress Lochia: appropriate Uterine Fundus: firm Incision: Prevena in place and functioning  DVT Evaluation: No evidence of DVT seen on physical exam.  Recent Labs    07/12/19 1201 07/13/19 0452  HGB 12.1 11.1*  HCT 36.1 34.3*    Assessment/Plan: Status post Cesarean section. Doing well postoperatively.  Continue current care  Encourage ambulation  Pt desires circumcision of female newborn r/ba discussed pt voiced understanding  She is interested in early discharge tomorrow if she and newborns are doing well .  Christophe Louis 07/13/2019, 10:33 AM

## 2019-07-13 NOTE — Lactation Note (Signed)
This note was copied from a baby's chart. Lactation Consultation Note  Patient Name: Leslie [name] [id number] Date: 07/13/2019 Reason for consult: Follow-up assessment;Mother's request;Difficult latch;Infant < 6lbs;Late-preterm 34-36.6wks;Multiple gestation  1857 - 1919 - I followed up with Ms. Charlie upon request to assist with latching babies. We attempted with both babies. Baby A refused the breast and appeared to be tongue sucking. Ms. Vestal's support person provided Baby A a bottle of pumped breast milk. Baby B did latch in football hold on Ms. Edsall's right breast for a few minutes with the assistance of a size 24 nipple shield. I reinforced suckling at the breast with some expressed breast milk provided via curved tip syringe while baby was latched. Mom was pleased to see baby latch. Baby fed a few minutes and stopped and went to sleep. I recommended that Ms. Bastidas then supplement by bottle.  We reviewed the LPTI feeding guidelines, and I provided reinforcement that factors such as infant gestation play a role in developmental readiness to breast feeding. Ms. Bluestein verbalized understanding and appeared comfortable with the plan.  I put in an OP consult follow up request.   Maternal Data Formula Feeding for Exclusion: No Has patient been taught Hand Expression?: Yes Does the patient have breastfeeding experience prior to this delivery?: Yes  Feeding Feeding Type: Breast Milk  LATCH Score Latch: Repeated attempts needed to sustain latch, nipple held in mouth throughout feeding, stimulation needed to elicit sucking reflex.  Audible Swallowing: None  Type of Nipple: Everted at rest and after stimulation  Comfort (Breast/Nipple): Soft / non-tender  Hold (Positioning): Assistance needed to correctly position infant at breast and maintain latch.  LATCH Score: 6  Interventions Interventions: Breast feeding basics reviewed;Assisted with latch;Hand  express;Breast compression;Adjust position;Expressed milk  Lactation Tools Discussed/Used Tools: Nipple Shields Nipple shield size: 24   Consult Status Consult Status: Follow-up Date: 07/14/19 Follow-up type: In-patient    Lenore Manner 07/13/2019, 8:16 PM

## 2019-07-13 NOTE — Lactation Note (Signed)
This note was copied from a baby's chart. Lactation Consultation Note  Patient Name: Leslie [name] [id number] Date: 07/13/2019 Reason for consult: Follow-up assessment;Late-preterm 34-36.6wks;Multiple gestation;Infant < 6lbs  1822 - I followed up with Leslie [name] to check on her progress with babies. She has not attempted to breast feed today. She has been focusing on pumping, and she states that she has had more overall body pain today. She asked about the safety of her pain medications with breast feeding, and I provided some guidance about dosage effects, safety, and any potential risks.  I offered to return later tonight upon request to assist with latching baby. We also discussed setting up a follow up appointment in OP setting to work on latching babies. 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  I reviewed basic feeding plan: feed babies 8-12 times a day on demand. Continue to pump every three hours, and continue with supplementing her milk via bottle if she is not feeding at the breast.   Maternal Data Does the patient have breastfeeding experience prior to this delivery?: Yes  Feeding Feeding Type: Bottle Fed - Breast Milk Nipple Type: Slow - flow   Lactation Tools Discussed/Used   DEBP  Consult Status Consult Status: Follow-up Date: 07/14/19 Follow-up type: In-patient    Lenore Manner 07/13/2019, 6:31 PM

## 2019-07-14 MED ORDER — ONDANSETRON HCL 4 MG PO TABS
8.0000 mg | ORAL_TABLET | Freq: Three times a day (TID) | ORAL | Status: DC | PRN
  Administered 2019-07-14: 09:00:00 8 mg via ORAL
  Filled 2019-07-14: qty 2

## 2019-07-14 NOTE — Lactation Note (Signed)
This note was copied from a baby's chart. Lactation Consultation Note  Patient Name: Leslie [name] Date: 07/14/2019   Mom has no breast complaints or questions about pumping.  Twin A is waking up to feed & parents have prepared 30 mL in a bottle for her. Twin B just finished feeding and took 30 mL with ease. Based on hours of life, I told Mom that 30 mL/feeding is appropriate, but to let have infants drink more if they want more.   Mom feels that infants are doing well with the yellow slow-flow nipple.    Matthias Hughs [facility] 07/14/2019, 1:30 PM

## 2019-07-14 NOTE — Progress Notes (Signed)
Subjective: Postpartum Day 2: Cesarean Delivery Patient reports incisional pain, tolerating PO, + flatus and no problems voiding.    Objective: Vital signs in last 24 hours: Temp:  [98.2 F (36.8 C)-98.5 F (36.9 C)] 98.5 F (36.9 C) (09/27 0508) Pulse Rate:  [69-82] 69 (09/27 0508) Resp:  [18] 18 (09/27 0508) BP: (132-137)/(79-85) 137/84 (09/27 0508) SpO2:  [99 %-100 %] 99 % (09/27 0508)  Physical Exam:  General: alert, cooperative and no distress Lochia: appropriate Uterine Fundus: firm Incision: prevena dressing in place an functioning properly  DVT Evaluation: No evidence of DVT seen on physical exam.  Recent Labs    07/12/19 1201 07/13/19 0452  HGB 12.1 11.1*  HCT 36.1 34.3*    Assessment/Plan: Status post Cesarean section. Doing well postoperatively.  Continue current care Nausea- zofran 8 mg po prn  Pain control - ibuprofen scheduled. Tylenol and oxycodone prn  D/c home tomorrow  Dr. Simona Huh assuming care at [facility] 07/15/2019.  Leslie [name] 07/14/2019, 10:49 AM

## 2019-07-14 NOTE — Progress Notes (Signed)
Pt has been nauseated since beginning of shift.  Have offered scheduled meds a few times but pt has asked to hold the meds until nausea has resolved.  Call MD to get PO zofran. See new order. Will continue to monitor.

## 2019-07-15 LAB — SURGICAL PATHOLOGY

## 2019-07-15 MED ORDER — IBUPROFEN 800 MG PO TABS: 800.0000 mg | ORAL_TABLET | Freq: Three times a day (TID) | ORAL | 0 refills | Status: AC

## 2019-07-15 MED ORDER — TRAMADOL HCL 50 MG PO TABS: 50.0000 mg | ORAL_TABLET | Freq: Four times a day (QID) | ORAL | 0 refills | Status: AC | PRN

## 2019-07-15 NOTE — Lactation Note (Signed)
This note was copied from a baby's chart. Lactation Consultation Note  Patient Name: Leslie [name] Date: 07/15/2019   Infant A has gained 16 g; Infant B has gained 21 g. Mom & Dad needed no assistance yesterday; Mom has a bountiful supply of milk. Vania Rea, RN was made aware that I will defer seeing Mom unless Mom requests to be seen by Lactation.   Matthias Hughs [facility] 07/15/2019, 7:52 AM

## 2019-07-15 NOTE — Discharge Summary (Signed)
OB Discharge Summary     Patient Name: Leslie [name] DOB: 1986/01/11 MRN: [id number]  Date of admission: 07/12/2019 Delivering MD:    Faatima, Tench [[id number]]  Aanika, Defoor Amaya [[id number]]  Geryl Rankins    Date of discharge: 07/15/2019  Admitting diagnosis: Z98.891 History of cesarean section Intrauterine pregnancy: [redacted]w[redacted]d     Secondary diagnosis:  Active Problems:   S/P repeat low transverse C-section  Additional problems: Umbilical hernia     Discharge diagnosis: Preterm Pregnancy Delivered and Twin gestation                                                                                                Post partum procedures:None  Augmentation: n/a  Complications: None  Hospital course:  Sceduled C/S   33 y.o. yo [id number] at [redacted]w[redacted]d was admitted to the hospital 07/12/2019 for scheduled cesarean section with the following indication:Prior Uterine Surgery and Multifetal Gestation.  Membrane Rupture Time/Date:    Lacrisha, Bielicki [[id number]]  8:05 AM    Elizzie, Westergard [[id number]]  8:06 AM  ,   Alyxandria, Wentz [[id number]]  07/12/2019    Apolonia, Ellwood [[id number]]  07/12/2019    Patient delivered a Viable infant.   Lynsie, Mcwatters [[id number]]  07/12/2019    Michela, Herst [[id number]]  07/12/2019   Details of operation can be found in separate operative note.  Pateint had an uncomplicated postpartum course.  She is ambulating, tolerating a regular diet, passing flatus, and urinating well. Patient is discharged home in stable condition on  07/29/19         Physical exam  Vitals:   07/14/19 0508 07/14/19 1348 07/14/19 2025 07/15/19 0526  BP: 137/84 131/72 135/74 125/81  Pulse: 69 82 74 78  Resp: 18 16 18 18   Temp: 98.5 F (36.9 C) 98.4 F (36.9 C) 98.1 F (36.7 C) 98.1 F (36.7 C)  TempSrc: Oral Oral Oral Oral  SpO2: 99%  100% 100%  Weight:       Height:       General: alert, cooperative and no distress Lochia: appropriate Uterine Fundus: firm Incision: Prevena in place with appropriate suction DVT Evaluation: No evidence of DVT seen on physical exam. Calf/Ankle edema is present Labs: Lab Results  Component Value Date   WBC 7.8 07/13/2019   HGB 11.1 (L) 07/13/2019   HCT 34.3 (L) 07/13/2019   MCV 94.2 07/13/2019   PLT 125 (L) 07/13/2019   CMP Latest Ref Rng & Units 07/12/2019  Creatinine 0.44 - 1.00 mg/dL 07/14/2019    Discharge instruction: per After Visit Summary and "Baby and Me Booklet".  After visit meds:  Allergies as of 07/15/2019   No Known Allergies     Medication List    STOP taking these medications   aspirin EC 81 MG tablet     TAKE these medications   ibuprofen 800 MG tablet Commonly known as: ADVIL Take 1 tablet (800 mg total) by mouth every 8 (eight) hours.   Integra 62.5-62.5-40-3 MG Caps Take 1 capsule by mouth daily.  prenatal multivitamin Tabs tablet Take 1 tablet by mouth daily at 12 noon.   traMADol 50 MG tablet Commonly known as: Ultram Take 1 tablet (50 mg total) by mouth every 6 (six) hours as needed for moderate pain or severe pain.            Discharge Care Instructions  (From admission, onward)         Start     Ordered   07/15/19 0000  Discharge wound care:    Comments: Remove dressing once alerted after 7-10 days.   07/15/19 0840          Diet: routine diet  Activity: Advance as tolerated. Pelvic rest for 6 weeks.   Outpatient follow up:2 weeks Follow up Appt:No future appointments. Follow up Visit:No follow-ups on file.  Postpartum contraception: Tubal Ligation  Newborn Data:   Mariabella, Nilsen [[id number]]  Live born female  Birth Weight: 5 lb 9.2 oz (2530 g) APGAR: 66, 9  Newborn Delivery   Birth date/time: 07/12/2019 08:05:00 Delivery type: C-Section, Low Transverse Trial of labor: No C-section categorization: Repeat       Marium, Ragan [[id number]]  Live born female  Birth Weight: 6 lb 10.2 oz (3010 g) APGAR: 8, 8  Newborn Delivery   Birth date/time: 07/12/2019 08:06:00 Delivery type: C-Section, Low Transverse Trial of labor: No C-section categorization: Repeat      Baby Feeding: Breast Disposition:home with mother x 2   07/15/2019 Thurnell Lose, MD

## 2019-07-15 NOTE — Discharge Instructions (Signed)

## 2021-08-04 ENCOUNTER — Encounter: Attending: Family Medicine

## 2021-11-12 ENCOUNTER — Encounter

## 2021-11-15 ENCOUNTER — Encounter: Attending: Hematology & Oncology

## 2021-11-15 ENCOUNTER — Encounter

## 2022-06-03 NOTE — Telephone Encounter (Signed)
emialed

## 2022-06-03 NOTE — Telephone Encounter (Signed)
Patient is requesting new patient paperwork be emailed to her so she can fill it out ahead of time

## 2022-10-06 ENCOUNTER — Encounter: Admit: 2022-10-06 | Discharge: 2022-10-06 | Payer: BLUE CROSS/BLUE SHIELD | Attending: Family | Primary: Family Medicine

## 2022-10-06 DIAGNOSIS — D508 Other iron deficiency anemias: Secondary | ICD-10-CM

## 2022-10-06 NOTE — Progress Notes (Signed)
Chief Complaint   Patient presents with    New Patient    Other     Due for pap- last one approx 06/2019    Immunizations     Varicella titer date 10/30/2012- immune and also did Hep B titer for school- immune           SUBJECTIVETEXT  HPI  Kristy Brown (DOB:  Jan 10, 1986) is a 36 y.o. female,New patient, here for evaluation of the following chief complaint(s):  New Patient, Other (Due for pap- last one approx 06/2019), and Immunizations (Varicella titer date 10/30/2012- immune and also did Hep B titer for school- immune )    Pt presents to establish care from [facility]. PMHX IDA and weight loss. Denies recent ED visits and hospitalizations. Shares has lump to left side of face, nontender, soft.     Umbilical hernia- had surgery scheduled years ago but then got pregnant and moved  IDA- Dr. Mariane Baumgarten hematology/onc for infusions, needs to reschedule  Pap smear-up to date, normal in 2020    Depression: At risk (10/06/2022)    PHQ-2     PHQ-2 Score: 5        Patient Active Problem List   Diagnosis    HTN (hypertension)    Acute conjunctivitis of both eyes    Hx of preterm delivery, currently pregnant, third trimester    Previous cesarean delivery affecting pregnancy    Twin pregnancy, dichorionic/diamniotic, third trimester    Iron deficiency anemia     Past Medical History:   Diagnosis Date    Iron deficiency anemia     Pregnancy induced hypertension      Social History     Socioeconomic History    Marital status: Unknown     Spouse name: None    Number of children: None    Years of education: None    Highest education level: None   Tobacco Use    Smoking status: Never    Smokeless tobacco: Never   Vaping Use    Vaping Use: Never used   Substance and Sexual Activity    Alcohol use: Not Currently    Drug use: Never   Social History Narrative    Married    Lives with 4 kids, husband, parents, brother    Occupation Facilities manager        Past Surgical History:   Procedure Laterality Date    CESAREAN SECTION       x4       No current outpatient medications on file.     No current facility-administered medications for this visit.      No Known Allergies        OBJECTIVE:  Vitals:    10/06/22 1434   BP: 124/82   Pulse: 92   Resp: 18   Temp: 98.8 F (37.1 C)   SpO2: 96%   Weight: 122.9 kg (271 lb)   Height: 1.676 m (5\' 6" )         PHYSICAL EXAM:  General: Well-nourished, well-developed individual, NAD.  Skin: >3cm facial lump to left face, soft, spongy, nontender  HEENT: Normocephalic, white sclera, no conjunctival injection, PERRL, pearly gray TM B/L, no nasal discharge, nasal septum midline, throat without lesions or exudates, clean dentition .No lymphadenopathy  Neck: Trachea midline. Thyroid midline, equally rises and falls, no enlargement, asymmetry, masses, nodules or tenderness appreciated on palpation.   Cardiac: RRR, S1 and S2 identified, no M/G/R or S3, S4on auscultation.  Pulmonary: Symmetric  chest lift, CTA B/L, no wheezes, rhonchi, rales on auscultation.   GI: Active bowel sounds x4 quadrants, soft, NTND abdomen, large umbilical hernia  Extremities: no deformities, ambulates with no weakness bilaterally on inspection, no varicosities, no cyanosis or edema.   Neuro: AAOx3, pleasant affect.      IMAGES and LABS  Results for orders placed or performed in visit on 10/06/22   HM PAP SMEAR   Result Value Ref Range    PAP Smear, External normal         ASSESSMENT &PLAN   Diagnosis Orders   1. Iron deficiency anemia secondary to inadequate dietary iron intake        2. Umbilical hernia without obstruction and without gangrene  [facility] - Aldean Ast MD, General Surgery - [place].      3. Obesity without serious comorbidity, unspecified classification, unspecified obesity type        4. Skin cyst  External Referral To Dermatology        IDA-followed by hemat/oncology  Likely benign cyst-pt prefers eval for possible removal vs other  Will f/u for obesity/weight loss    Return in about 6 weeks (around 11/17/2022) for  weight loss/obesity .    An electronic signature was used to authenticate this note.    --Jeannetta Ellis, APRN - NP

## 2022-11-03 ENCOUNTER — Ambulatory Visit
Admit: 2022-11-03 | Discharge: 2022-11-03 | Payer: BLUE CROSS/BLUE SHIELD | Attending: Surgery | Primary: Family Medicine

## 2022-11-03 DIAGNOSIS — K429 Umbilical hernia without obstruction or gangrene: Secondary | ICD-10-CM

## 2022-11-03 NOTE — Progress Notes (Signed)
[facility] Note        PCP: Allayne Butcher, DO    Chief Complaint   Patient presents with    New Patient     Umbilical hernia        HPI: Kristy Brown is a 37 y.o. female who presents in consultation for evaluation of long-term knowledge of umbilical hernia and rectus diastases.  States she has had 5 children.  The umbilical hernia and rectus diastases have been present ever since.  She initially was set up and scheduled with a surgeon back in [place] back in 2018 but got pregnant with her fifth child right before the surgery.  She states the hernia does not necessarily bother her at all.  A little bit of discomfort with certain activities and positions from the diastases but not lifestyle limiting.  States she is new to town was new to follow-up with Dr. Lottie Rater and her team and so they made referral for evaluation and discussion of her umbilical hernia and diastases.  No obstructive type symptoms.  No nausea no vomiting.    Past Medical History:   Diagnosis Date    Iron deficiency anemia     Pregnancy induced hypertension        Past Surgical History:   Procedure Laterality Date    CESAREAN SECTION      x4       No current outpatient medications on file.     No Known Allergies    Social History     Socioeconomic History    Marital status: Unknown     Spouse name: None    Number of children: None    Years of education: None    Highest education level: None   Tobacco Use    Smoking status: Never    Smokeless tobacco: Never   Vaping Use    Vaping Use: Never used   Substance and Sexual Activity    Alcohol use: Not Currently    Drug use: Never   Social History Narrative    Married    Lives with 4 kids, husband, parents, brother    Occupation Midwife       Family History   Problem Relation Age of Onset    Hypertension Mother     Hypertension Father     Other Sister         autoimmune disease    Autism Brother     Diabetes Paternal Grandfather     Other Daughter          neurological issues; mitochondral 4 deficiency    Breast Cancer Neg Hx     Prostate Cancer Neg Hx     Colon Cancer Neg Hx        Review of Systems   Constitutional:  Negative for chills, fever and unexpected weight change.   Eyes:  Negative for visual disturbance.   Respiratory:  Negative for cough and shortness of breath.    Cardiovascular:  Negative for chest pain and palpitations.   Gastrointestinal:  Positive for abdominal distention. Negative for abdominal pain, constipation, diarrhea and nausea.   Skin:  Negative for rash and wound.   Neurological:  Negative for dizziness and weakness.   Psychiatric/Behavioral:  Negative for behavioral problems.                Objective:  Vitals:    11/03/22 1513   Weight: 122.9 kg (271 lb)  Physical Exam  Constitutional:       General: She is not in acute distress.     Appearance: Normal appearance.   HENT:      Head: Normocephalic and atraumatic.      Mouth/Throat:      Mouth: Mucous membranes are moist.      Pharynx: Oropharynx is clear.   Eyes:      General: No scleral icterus.     Conjunctiva/sclera: Conjunctivae normal.   Cardiovascular:      Rate and Rhythm: Normal rate and regular rhythm.   Pulmonary:      Effort: Pulmonary effort is normal. No respiratory distress.      Breath sounds: No wheezing.   Abdominal:      General: There is distension.      Palpations: Abdomen is soft. There is no mass.      Tenderness: There is no abdominal tenderness.      Hernia: A hernia (2 cm umbilical hernia, fat-containing.  Reducible.  Large wide supraumbilical rectus diastases) is present.   Musculoskeletal:         General: No deformity or signs of injury. Normal range of motion.      Cervical back: Normal range of motion. No tenderness.   Skin:     General: Skin is warm.      Coloration: Skin is not jaundiced.      Findings: No lesion or rash.   Neurological:      Mental Status: She is alert. Mental status is at baseline.           Assessment/Plan:       1. Umbilical hernia  without obstruction and without gangrene  2. Rectus diastasis     2 cm umbilical hernia.  Soft and reducible.  Nontender.  Probably 10 cm wide rectus diastases from the umbilicus to the xiphoid.  A little bit tender with palpation.  We discussed given her current weight and the fact that she is really asymptomatic I would not recommend repair.  Repair of her hernia alone given her weight and diastases with pretty much guarantee of recurrence within about 5 to 7 years.  We discussed the risks associated with this.  Feel she is at low risk for acute incarceration.  She is not interested in repair in the light of our discussion.  I think that is reasonable.  We discussed red flag symptoms.  She knows to call with questions or concerns.        Return for Questions or concerns on a PRN basis.            Marijean Bravo, MD FACS          NOTE: This report, in part or full,may have been transcribed using voice recognition software. Every effort was made to ensure accuracy; however, inadvertent computerized transcription errors may be present. Please excuse any transcriptional grammatical or spelling errors that may have escaped my editorial review.

## 2022-11-17 ENCOUNTER — Ambulatory Visit
Admit: 2022-11-17 | Discharge: 2022-11-17 | Payer: PRIVATE HEALTH INSURANCE | Attending: Family Medicine | Primary: Family Medicine

## 2022-11-17 DIAGNOSIS — D508 Other iron deficiency anemias: Secondary | ICD-10-CM

## 2022-11-17 LAB — COMPREHENSIVE METABOLIC PANEL
ALT: 13 U/L (ref 0–35)
AST: 15 U/L (ref 0–35)
Albumin/Globulin Ratio: 1.7 (ref 1.00–2.70)
Albumin: 4.3 g/dL (ref 3.5–5.2)
Alk Phosphatase: 46 U/L (ref 35–117)
Anion Gap: 7 mmol/L (ref 2–17)
BUN: 8 mg/dL (ref 6–20)
CO2: 28 mmol/L (ref 22–29)
Calcium: 9.3 mg/dL (ref 8.6–10.0)
Chloride: 105 mmol/L (ref 98–107)
Creatinine: 0.8 mg/dL (ref 0.5–1.0)
Est, Glom Filt Rate: 97 mL/min/1.73m (ref >=60)
Globulin: 2.5 g/dL (ref 1.9–4.4)
Glucose: 105 mg/dL — ABNORMAL HIGH (ref 70–99)
OSMOLALITY CALCULATED: 278 mOsm/kg (ref 270–287)
Potassium: 4.4 mmol/L (ref 3.5–5.3)
Sodium: 140 mmol/L (ref 135–145)
Total Bilirubin: 0.16 mg/dL (ref 0.00–1.20)
Total Protein: 6.8 g/dL (ref 6.4–8.3)

## 2022-11-17 LAB — TSH WITH REFLEX: TSH: 0.887 mcIU/mL (ref 0.358–3.740)

## 2022-11-17 LAB — CBC WITH AUTO DIFFERENTIAL
Absolute Baso #: 0.1 10*3/uL (ref 0.0–0.2)
Absolute Eos #: 0.3 10*3/uL (ref 0.0–0.5)
Absolute Lymph #: 1.7 10*3/uL (ref 1.0–3.2)
Absolute Mono #: 0.5 10*3/uL (ref 0.3–1.0)
Basophils %: 1.1 % (ref 0.0–2.0)
Eosinophils %: 6.2 % (ref 0.0–7.0)
Hematocrit: 32 % — ABNORMAL LOW (ref 34.0–47.0)
Hemoglobin: 9.9 g/dL — ABNORMAL LOW (ref 11.5–15.7)
Immature Grans (Abs): 0.01 10*3/uL (ref 0.00–0.06)
Immature Granulocytes: 0.2 % (ref 0.0–0.6)
Lymphocytes: 36.9 % (ref 15.0–45.0)
MCH: 26.7 pg — ABNORMAL LOW (ref 27.0–34.5)
MCHC: 30.9 g/dL — ABNORMAL LOW (ref 32.0–36.0)
MCV: 86.3 fL (ref 81.0–99.0)
MPV: 11.6 fL (ref 7.2–13.2)
Monocytes: 10.6 % (ref 4.0–12.0)
NRBC Absolute: 0 10*3/uL (ref 0.000–0.012)
NRBC Automated: 0 % (ref 0.0–0.2)
Neutrophils %: 45 % (ref 42.0–74.0)
Neutrophils Absolute: 2 10*3/uL (ref 1.6–7.3)
Platelets: 268 10*3/uL (ref 140–440)
RBC: 3.71 x10e6/mcL (ref 3.60–5.20)
RDW: 13.9 % (ref 11.0–16.0)
WBC: 4.5 10*3/uL (ref 3.8–10.6)

## 2022-11-17 LAB — HEMOGLOBIN A1C
Est. Avg. Glucose, WB: 128
Est. Avg. Glucose-calculated: 140
Hemoglobin A1C: 6.1 % — ABNORMAL HIGH (ref 4.0–6.0)

## 2022-11-17 NOTE — Progress Notes (Signed)
Kristy Brown (DOB:  15-Jan-1986) is a 37 y.o. female, here for evaluation of the following chief complaint(s):  Follow-up (6 weeks ) and Other (Last pap 2020)      Vitals:    11/17/22 0752   BP: 128/84   Pulse: 70   Resp: 18   Temp: 98.6 F (37 C)   SpO2: 98%   Weight: 121.9 kg (268 lb 12.8 oz)   Height: 1.676 m (5\' 6" )           ASSESSMENT/PLAN:  1. Iron deficiency anemia secondary to inadequate dietary iron intake  -     CBC with Auto Differential; Future  2. Umbilical hernia without obstruction and without gangrene  3. Class 3 severe obesity without serious comorbidity with body mass index (BMI) of 40.0 to 44.9 in adult, unspecified obesity type ([place])  4. Weight gain  -     TSH with Reflex; Future  -     Comprehensive Metabolic Panel; Future  -     Hemoglobin A1C; Future  -     Routine Venipuncture (95284)  Longer discussion regarding options for weight loss.  Recommended she return to weight watchers as this seems to be the plan that she enjoyed most.  Will see how she is doing within the next 4 weeks, will consider additional medication, possibly semaglutide.    Return in about 4 weeks (around 12/15/2022) for in office with me..         Subjective   SUBJECTIVE/OBJECTIVE:  HPI  Patient presents today for follow-up with past medical history of iron deficiency anemia, umbilical hernia, acute obesity and she would like to discuss options for weight loss.  She is currently not engaged in a structured nutrition or exercise plan but she has been in the past with some varying degrees of success.  She states she is on weight watchers, she worked with a Physiological scientist, she did also do meal replacement plan with [employer].  None of her weight loss has been lasting.  She states when she comes off the plan she is really not able to maintain weight loss.  We did spend a good amount of time discussing behaviors and situations that have thrown her off track.  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  Otherwise she is establish care with general surgery, they are observing her umbilical hernia.  She is due for labs to check her blood count       Review of Systems   As discussed in HPI, otherwise negative.     Objective   Physical Exam    PHYSICAL EXAM:  GENERAL APPEARANCE: well developed, well nourished, Patient is alert and oriented X 3. No acute distress. Appropriate affect. Marland Kitchen   SKIN: no suspicious lesions, warm and dry .   EXTREMITIES: no edema, clubbing or cyanosis .   NEUROLOGIC:  nonfocal, alert and oriented.   PSYCH:  alert, oriented, cognitive function intact, cooperative with exam,           Available records and labs reviewed and discussed    Office Visit on 10/06/2022   Component Date Value Ref Range Status    PAP Smear, External 06/26/2019 normal   Final      No results found for this visit on 11/17/22.   No results found for any visits on 11/17/22.  No Known Allergies   Prior to Admission medications    Not on File  Family History   Problem Relation Age of Onset    Hypertension Mother     Hypertension Father     Other Sister         autoimmune disease    Autism Brother     Diabetes Paternal Grandfather     Other Daughter         neurological issues; mitochondral 4 deficiency    Breast Cancer Neg Hx     Prostate Cancer Neg Hx     Colon Cancer Neg Hx       Social History     Socioeconomic History    Marital status: Unknown     Spouse name: Not on file    Number of children: Not on file    Years of education: Not on file    Highest education level: Not on file   Occupational History    Not on file   Tobacco Use    Smoking status: Never    Smokeless tobacco: Never   Vaping Use    Vaping Use: Never used   Substance and Sexual Activity    Alcohol use: Not Currently    Drug use: Never    Sexual activity: Not on file   Other Topics Concern    Not on file   Social History Narrative    Married    Lives with 4 kids, husband, parents, brother    Occupation Midwife     Social Determinants of  Health     Financial Resource Strain: Not on file   Food Insecurity: Not on file   Transportation Needs: Not on file   Physical Activity: Not on file   Stress: Not on file   Social Connections: Not on file   Intimate Partner Violence: Not on file   Housing Stability: Not on file      Past Surgical History:   Procedure Laterality Date    CESAREAN SECTION      x4      Past Medical History:   Diagnosis Date    Iron deficiency anemia     Pregnancy induced hypertension         On this date 11/17/2022 I have spent 31 minutes reviewing previous notes, test results and face to face with the patient discussing the diagnosis and importance of compliance with the treatment plan as well as documenting on the day of the visit.      An electronic signature was used to authenticate this note.    --Allayne Butcher, DO

## 2022-11-18 ENCOUNTER — Telehealth: Admit: 2022-11-18 | Discharge: 2022-11-18 | Payer: BLUE CROSS/BLUE SHIELD | Attending: Family | Primary: Family Medicine

## 2022-11-18 DIAGNOSIS — R0683 Snoring: Secondary | ICD-10-CM

## 2022-11-18 NOTE — Progress Notes (Signed)
Kristy Brown, was evaluated through a synchronous (real-time) audio-video encounter. The patient (or guardian if applicable) is aware that this is a billable service, which includes applicable co-pays. This Virtual Visit was conducted with patient's (and/or legal guardian's) consent. Patient identification was verified, and a caregiver was present when appropriate.   The patient was located at Home: [address]  [place] [address]  Provider was located at [facility] ([place] [place] [facility]): [address]  [place] [address]      Kristy Brown (DOB:  [birth year]) is a Established patient, presenting virtually for evaluation of the following:    Assessment & Plan   Below is the assessment and plan developed based on review of pertinent history, physical exam, labs, studies, and medications.  1. Snoring  -     Home Sleep Study; Future  2. Witnessed apneic spells  -     Home Sleep Study; Future    Return if symptoms worsen or fail to improve.       Subjective   11/18/22  Forgot to mention at yesterday's appointment concerns for snoring, daytime somnolence. She mentions her husband has witnessed her have apnea spells in her sleep. Denies any new headaches that her new. Fatigue is chronic. Never had a sleep study before and interested in this today.     11/17/22  Patient presents today for follow-up with past medical history of iron deficiency anemia, umbilical hernia, acute obesity and she would like to discuss options for weight loss.  She is currently not engaged in a structured nutrition or exercise plan but she has been in the past with some varying degrees of success.  She states she is on weight watchers, she worked with a Physiological scientist, she did also do meal replacement plan with [employer].  None of her weight loss has been lasting.  She states when she comes off the plan she is really not able to maintain weight loss.  We did spend a good amount of time discussing behaviors and situations that have  thrown her off track.  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  Otherwise she is establish care with general surgery, they are observing her umbilical hernia.  She is due for labs to check her blood count      Review of Systems   All other systems reviewed and are negative.         Objective   Patient-Reported Vitals  No data recorded     Physical Exam  GENERAL APPEARANCE: well developed, well nourished, in no acute distress.   HEAD: normocephalic, atraumatic.   EYES: Pupils equal and round, conjuctiva clear, lids normal.   EARS: Externally grossly normal. Hearing grossly intact.   NOSE: no external lesions.   NECK: neck supple, normal active ROM exhibited.   LYMPH NODES: no palpable adenopathy.   LUNGS: Breathing comfortably, no audible wheezes on expiration.   CHEST: normal movement  EXTREMITIES: Appears well perfused.Marland Kitchen   NEUROLOGIC: CN grossly intact., cognitive exam grossly normal.   Previous results reviewed and discussed.           --Edman Circle, APRN - NP

## 2022-11-18 NOTE — Telephone Encounter (Signed)
Pt scheduled

## 2022-11-18 NOTE — Telephone Encounter (Signed)
From: Kristy Brown  To: Dr. Allayne Butcher  Sent: 11/18/2022 5:34 AM EST  Subject: Appointment follow up question (sleep apnea)    Good Morning,  Yesterday at my appointment I forgot to bring up sleep apnea. My husband says that over the years, I have gone from never snoring to snoring really bad and he says often times he has to wake me up out of fear that I stopped breathing. I sleep on my side. I was wondering if there is any way I can get this checked out?    Thanks.  Kristy Brown

## 2022-12-22 ENCOUNTER — Ambulatory Visit
Admit: 2022-12-22 | Discharge: 2022-12-22 | Payer: BLUE CROSS/BLUE SHIELD | Attending: Family Medicine | Primary: Family Medicine

## 2022-12-22 DIAGNOSIS — R0681 Apnea, not elsewhere classified: Secondary | ICD-10-CM

## 2022-12-22 NOTE — Telephone Encounter (Signed)
Spoke to [facility] at [employer] to call in rx under media as fax failed- I had the same issue the other day and fax # was verified.

## 2022-12-22 NOTE — Progress Notes (Signed)
 Kristy Brown (DOB:  04-05-1986) is a 37 y.o. female, here for evaluation of the following chief complaint(s):  Follow-up (4 weeks ) and Other (Simon checked/Pap?)      Vitals:    12/22/22 1130   BP: 128/82   Pulse: 99   SpO2: 99%   Weight: 122 kg (269 lb)   Height: 1.676 m (5' 6)           ASSESSMENT/PLAN:  1. Witnessed apneic spells  Comments:  Unstable, will will look into status of sleep study  2. Snoring  3. Iron  deficiency anemia secondary to inadequate dietary iron  intake  Comments:  Chronic, stable, current medications reviewed and discussed, continued (moderate risk).  4. Weight gain  5. Class 3 severe obesity without serious comorbidity with body mass index (BMI) of 40.0 to 44.9 in adult, unspecified obesity type (HCC)  Comments:  Unstable, she has gained weight since last visit.  Recommended E2 M, we discussed options for compounded semaglutide    Prescription sent to [facility] for 2 tirzepatide  15 mg/mL 17 units weekly for the next 4 weeks    Return for 4-6 weeks in office, any provider. .         Subjective   SUBJECTIVE/OBJECTIVE:  HPI  Patient presents today for follow-up regarding recent visit.  She still has not been contacted to schedule her sleep study.  Unfortunately she has gained a pound since her last visit, she started exercising 2-3 times a week and she feels she has been eating better but not following a structured plan.       Review of Systems   As discussed in HPI, otherwise negative.     Objective   Physical Exam    PHYSICAL EXAM:  GENERAL APPEARANCE: well developed, well nourished, Patient is alert and oriented X 3. No acute distress. Appropriate affect. Kristy Brown   HEAD: normocephalic, atraumatic .   EYES: pupils equal, round, reactive to light, EOMI.    Kristy Brown   NEUROLOGIC:  nonfocal, alert and oriented.   PSYCH:  alert, oriented, cognitive function intact, cooperative with exam,           Prior labs reviewed and discussed    Office Visit on 11/17/2022   Component Date Value Ref  Range Status    Hemoglobin A1C 11/17/2022 6.1 (H)  4.0 - 6.0 % Final    Comment: HEMOGLOBIN A1C INTERPRETATION:    The following arbitrary ranges may be used for interpretation of the results.  However, factors such as duration of diabetes, adherence to therapy, and  patient age should also be considered in assessing degree of blood glucose  control.    Hemoglobin A1C                 Avg. Blood Sugar  --------------------------------------------------------------  6%                           135 mg/dL  7%                           170 mg/dL  8%                           205 mg/dL  9%                           240 mg/dL  10%                          275 mg/dL    ======================================================    A1C                      Glucose Control  ----------------------------------------------------------------  < 6.0 %                   Normal  6.0 - 6.9 %               Abnormal  7.0 - 7.9 %               Sub-Optimal Control  > 8.0 %                   Inadequate Control      Est. Avg. Glucose, WB 11/17/2022 128   Final    Est. Avg. Glucose-calculated 11/17/2022 140   Final    WBC 11/17/2022 4.5  3.8 - 10.6 x10e3/mcL Final    RBC 11/17/2022 3.71  3.60 - 5.20 x10e6/mcL Final    Hemoglobin 11/17/2022 9.9 (L)  11.5 - 15.7 g/dL Final    Hematocrit 97/98/7975 32.0 (L)  34.0 - 47.0 % Final    MCV 11/17/2022 86.3  81.0 - 99.0 fL Final    MCH 11/17/2022 26.7 (L)  27.0 - 34.5 pg Final    MCHC 11/17/2022 30.9 (L)  32.0 - 36.0 g/dL Final    RDW 97/98/7975 13.9  11.0 - 16.0 % Final    Platelets 11/17/2022 268  140 - 440 x10e3/mcL Final    MPV 11/17/2022 11.6  7.2 - 13.2 fL Final    NRBC Automated 11/17/2022 0.0  0.0 - 0.2 % Final    NRBC Absolute 11/17/2022 0.000  0.000 - 0.012 x10e3/mcL Final    Neutrophils % 11/17/2022 45.0  42.0 - 74.0 % Final    Lymphocytes 11/17/2022 36.9  15.0 - 45.0 % Final    Monocytes 11/17/2022 10.6  4.0 - 12.0 % Final    Eosinophils % 11/17/2022 6.2  0.0 - 7.0 % Final    Basophils %  11/17/2022 1.1  0.0 - 2.0 % Final    Neutrophils Absolute 11/17/2022 2.0  1.6 - 7.3 x10e3/mcL Final    Absolute Lymph # 11/17/2022 1.7  1.0 - 3.2 x10e3/mcL Final    Absolute Mono # 11/17/2022 0.5  0.3 - 1.0 x10e3/mcL Final    Absolute Eos # 11/17/2022 0.3  0.0 - 0.5 x10e3/mcL Final    Absolute Baso # 11/17/2022 0.1  0.0 - 0.2 x10e3/mcL Final    Immature Granulocytes 11/17/2022 0.2  0.0 - 0.6 % Final    Immature Grans (Abs) 11/17/2022 0.01  0.00 - 0.06 x10e3/mcL Final    Sodium 11/17/2022 140  135 - 145 mmol/L Final    Potassium 11/17/2022 4.4  3.5 - 5.3 mmol/L Final    Chloride 11/17/2022 105  98 - 107 mmol/L Final    CO2 11/17/2022 28  22 - 29 mmol/L Final    Glucose 11/17/2022 105 (H)  70 - 99 mg/dL Final    BUN 97/98/7975 8  6 - 20 mg/dL Final    Creatinine 97/98/7975 0.8  0.5 - 1.0 mg/dL Final    Anion Gap 97/98/7975 7  2 - 17 mmol/L Final    OSMOLALITY CALCULATED 11/17/2022 278  270 - 287 mOsm/kg Final    Calcium 11/17/2022  9.3  8.6 - 10.0 mg/dL Final    Total Protein 11/17/2022 6.8  6.4 - 8.3 g/dL Final    Albumin 97/98/7975 4.3  3.5 - 5.2 g/dL Final    Globulin 97/98/7975 2.5  1.9 - 4.4 g/dL Final    Albumin/Globulin Ratio 11/17/2022 1.70  1.00 - 2.70 Final    Total Bilirubin 11/17/2022 0.16  0.00 - 1.20 mg/dL Final    Alk Phosphatase 11/17/2022 46  35 - 117 unit/L Final    AST 11/17/2022 15  0 - 35 unit/L Final    ALT 11/17/2022 13  0 - 35 unit/L Final    Est, Glom Filt Rate 11/17/2022 97  >=60 mL/min/1.76m Final    Comment: VERIFIED by Discern Expert.  GFR Interpretation:                                                                         % OF  KIDNEY  GFR                                                        STAGE  FUNCTION  ==================================================================================    > 90        Normal kidney function                       STAGE 1  90-100%  89 to 60      Mild loss of kidney function                 STAGE 2  80-60%  59 to 45      Mild to moderate loss of  kidney function     STAGE 3a  59-45%  44 to 30      Moderate to severe loss of kidney function   STAGE 3b  44-30%  29 to 15      Severe loss of kidney function               STAGE 4  29-15%    < 15        Kidney failure                               STAGE 5  <15%  ==================================================================================  Modified from [employer]    GFR Calculation performed using the CKD-EPI 2021 equation developed for use  with IDMS traceable creatinine methods and                            is the calculation recommended by  the [facility] for estimating GFR in adults.      TSH 11/17/2022 0.887  0.358 - 3.740 mcIU/mL Final    Comment: TSH INTERPRETATION:    Controversy exists over an acceptable TSH range. Some experts argue that a  narrower reference range is better and will increase the detection of thyroid  disease, particularly marginal hypothyroidism.  No results found for this visit on 12/22/22.   No results found for any visits on 12/22/22.  No Known Allergies   Prior to Admission medications    Not on File      Family History   Problem Relation Age of Onset    Hypertension Mother     Hypertension Father     Other Sister         autoimmune disease    Autism Brother     Diabetes Paternal Grandfather     Other Daughter         neurological issues; mitochondral 4 deficiency    Breast Cancer Neg Hx     Prostate Cancer Neg Hx     Colon Cancer Neg Hx       Social History     Socioeconomic History    Marital status: Unknown     Spouse name: Not on file    Number of children: Not on file    Years of education: Not on file    Highest education level: Not on file   Occupational History    Not on file   Tobacco Use    Smoking status: Never    Smokeless tobacco: Never   Vaping Use    Vaping Use: Never used   Substance and Sexual Activity    Alcohol use: Not Currently    Drug use: Never    Sexual activity: Not on file   Other Topics Concern    Not on file    Social History Narrative    Married    Lives with 4 kids, husband, parents, brother    Occupation Facilities Manager     Social Determinants of Health     Financial Resource Strain: Not on file   Food Insecurity: Not on file   Transportation Needs: Not on file   Physical Activity: Not on file   Stress: Not on file   Social Connections: Not on file   Intimate Partner Violence: Not on file   Housing Stability: Not on file      Past Surgical History:   Procedure Laterality Date    CESAREAN SECTION      x4      Past Medical History:   Diagnosis Date    Iron  deficiency anemia     Pregnancy induced hypertension               An electronic signature was used to authenticate this note.    --Greig JAYSON Alvine, DO

## 2023-01-19 NOTE — Telephone Encounter (Signed)
Pt returning missed call. Pt's not sure the reason for call    Please advise

## 2023-01-19 NOTE — Telephone Encounter (Signed)
Tried calling back but dont see any notes as to where anyone callled her

## 2023-01-20 NOTE — Telephone Encounter (Signed)
Informed pt of results of sleep study and faxed order for Cpap to [facility] @ [phone].

## 2023-02-07 ENCOUNTER — Telehealth
Admit: 2023-02-07 | Discharge: 2023-02-07 | Payer: PRIVATE HEALTH INSURANCE | Attending: Family | Primary: Family Medicine

## 2023-02-07 DIAGNOSIS — Z7689 Persons encountering health services in other specified circumstances: Secondary | ICD-10-CM

## 2023-02-07 NOTE — Progress Notes (Signed)
Kristy Brown, was evaluated through a synchronous (real-time) audio-video encounter. The patient (or guardian if applicable) is aware that this is a billable service, which includes applicable co-pays. This Virtual Visit was conducted with patient's (and/or legal guardian's) consent. Patient identification was verified, and a caregiver was present when appropriate.   The patient was located at Home: [address]  [place] [address]  Provider was located at Home (Appt Dept State): [place]  Confirm you are appropriately licensed, registered, or certified to deliver care in the state where the patient is located as indicated above. If you are not or unsure, please re-schedule the visit: Yes, I confirm.      Total time spent for this encounter: Not billed by time    --Sherren Kerns, APRN - NP on 02/07/2023 at 4:01 PM    An electronic signature was used to authenticate this note.   02/07/2023    TELEHEALTH EVALUATION -- Audio/Visual (During COVID-19 public health emergency)    HPI:    Kristy Brown (DOB:  06/05/1986) has requested an audio/video evaluation for the following concern(s):    Pt presents for 6 week f/u. She was started on tirzepatide from compounded pharmacy. She is going on week 4-5.   She denies changes in bowel habits, nausea, vomiting. She denies issues at injection site.   Prior weight:269, weight from home 259.    24 hour diet recall:  Breakfast: bacon, scrambled eggs, biscuit, coffee from work, creamer, no sugar  Snack:none  Lunch: grilled chicken salad, lemonade with tea  Snack: none  Dinner:none    Patient Active Problem List   Diagnosis    HTN (hypertension)    Acute conjunctivitis of both eyes    Hx of preterm delivery, currently pregnant, third trimester    Previous cesarean delivery affecting pregnancy    Twin pregnancy, dichorionic/diamniotic, third trimester    Iron deficiency anemia    Class 3 severe obesity without serious comorbidity with body mass index (BMI) of 40.0 to 44.9 in adult  [facility])      Social History     Tobacco Use    Smoking status: Never    Smokeless tobacco: Never   Vaping Use    Vaping Use: Never used   Substance Use Topics    Alcohol use: Not Currently    Drug use: Never      No Known Allergies   Prior to Visit Medications    Not on File       Also as discussed in HPI, ROS otherwise negative.        PHYSICAL EXAMINATION:  [ INSTRUCTIONS:  " " Indicates a positive item  " " Indicates a negative item  -- DELETE ALL ITEMS NOT EXAMINED]  There were no vitals filed for this visit.     Constitutional:  Appears well-developed and well-nourished  No apparent distress       Abnormal-   Mental status   Alert and awake   Oriented to person/place/time Able to follow commands      Eyes:  EOM      Normal   Abnormal-  Sclera    Normal   Abnormal -         Discharge   None visible   Abnormal -    HENT:    Normocephalic, atraumatic.   Abnormal    Mouth/Throat: Mucous membranes are moist.     External Ears  Normal   Abnormal-     Neck:  No visualized mass  Pulmonary/Chest: [x]  Respiratory effort normal.  [x]  No visualized signs of difficulty breathing or respiratory distress        []  Abnormal-      Musculoskeletal:   []  Normal gait with no signs of ataxia         [x]  Normal range of motion of neck        []  Abnormal-       Neurological:        [x]  No Facial Asymmetry (Cranial nerve 7 motor function) (limited exam to video visit)          []  No gaze palsy        []  Abnormal-         Skin:        [x]  No significant exanthematous lesions or discoloration noted on facial skin         []  Abnormal-            Psychiatric:       [x]  Normal Affect []  No Hallucinations        []  Abnormal-     Other pertinent observable physical exam findings-     ASSESSMENT/PLAN:   Diagnosis Orders   1. Encounter for weight management        2. Class 3 severe obesity without serious comorbidity with body mass index (BMI) of 40.0 to 44.9 in adult, unspecified obesity type  (HCC)      uncontrolled. reviewed labs, discussed increase protein to 130 g daily. discussed options on how . increase fiber. work on adding weights      3. Prediabetes      is in prediabetes range. sent information regarding diet. likely related to insulin resistance. will do labs in f/u            Return in about 4 weeks (around 03/07/2023) for 4-5 week obesity/weight f/u.

## 2023-02-07 NOTE — Telephone Encounter (Signed)
Send NEW prescription for tirzepatide 33 units , no refill to [facility]. F/u in 4-5 weeks

## 2023-02-07 NOTE — Telephone Encounter (Signed)
Hold till thurs

## 2023-02-09 NOTE — Telephone Encounter (Signed)
Form gave to shannin

## 2023-05-23 NOTE — Telephone Encounter (Signed)
Sleep study completed with [facility] but she is over due for an appt so message sent.

## 2023-07-11 ENCOUNTER — Ambulatory Visit
Admit: 2023-07-11 | Discharge: 2023-07-11 | Payer: PRIVATE HEALTH INSURANCE | Attending: Family | Primary: Family Medicine

## 2023-07-11 VITALS — BP 112/84 | HR 67 | Temp 97.60000°F | Wt 256.0 lb

## 2023-07-11 DIAGNOSIS — Z6841 Body Mass Index (BMI) 40.0 and over, adult: Secondary | ICD-10-CM

## 2023-07-11 LAB — IRON AND TIBC
Iron % Saturation: 6 % — ABNORMAL LOW (ref 20–40)
Iron: 23 ug/dL — ABNORMAL LOW (ref 37–145)
TIBC: 375 ug/dL (ref 250–450)
UIBC: 352 ug/dL — ABNORMAL HIGH (ref 112.0–347.0)

## 2023-07-11 LAB — CBC WITH AUTO DIFFERENTIAL
Basophils %: 1 % (ref 0.0–2.0)
Basophils Absolute: 0 10*3/uL (ref 0.0–0.2)
Eosinophils %: 6.1 % (ref 0.0–7.0)
Eosinophils Absolute: 0.3 10*3/uL (ref 0.0–0.5)
Hematocrit: 29.7 % — ABNORMAL LOW (ref 34.0–47.0)
Hemoglobin: 9 g/dL — ABNORMAL LOW (ref 11.5–15.7)
Immature Grans (Abs): 0.01 10*3/uL (ref 0.00–0.06)
Immature Granulocytes %: 0.2 % (ref 0.0–0.6)
Lymphocytes Absolute: 1.6 10*3/uL (ref 1.0–3.2)
Lymphocytes: 37.9 % (ref 15.0–45.0)
MCH: 24.1 pg — ABNORMAL LOW (ref 27.0–34.5)
MCHC: 30.3 g/dL (ref 30.0–36.0)
MCV: 79.4 fL — ABNORMAL LOW (ref 81.0–99.0)
MPV: 11.7 fL (ref 7.0–12.2)
Monocytes %: 13.6 % — ABNORMAL HIGH (ref 4.0–12.0)
Monocytes Absolute: 0.6 10*3/uL (ref 0.3–1.0)
NRBC Absolute: 0 10*3/uL (ref 0.000–0.012)
NRBC Automated: 0 % (ref 0.0–0.2)
Neutrophils %: 41.2 % — ABNORMAL LOW (ref 42.0–74.0)
Neutrophils Absolute: 1.7 10*3/uL (ref 1.6–7.3)
Platelets: 289 10*3/uL (ref 140–440)
RBC: 3.74 x10e6/mcL (ref 3.60–5.20)
RDW: 14.9 % (ref 10.0–17.0)
WBC: 4.1 10*3/uL (ref 3.8–10.6)

## 2023-07-11 LAB — FERRITIN: Ferritin: 5 ng/mL — ABNORMAL LOW (ref 13.0–150.0)

## 2023-07-11 MED ORDER — TIRZEPATIDE-WEIGHT MANAGEMENT 2.5 MG/0.5ML SC SOAJ
2.5 | SUBCUTANEOUS | 0 refills | Status: DC
Start: 2023-07-11 — End: 2023-07-24

## 2023-07-11 NOTE — Progress Notes (Signed)
 Subjective    HPI:    Kristy Brown (DOB:  Aug 17, 1986) is a 37 y.o. female,Established patient, here for evaluation of the following chief complaint(s):  Weight Management (In school having issues with concentration)        Presents for overdue f/u weight loss management. Was on compounded tirzepatide  and did not come back in office for refill since may. She has lost 13 lbs since then in addition to prior weight loss. Denies abdominal pain, Gi discomfort, n/v/d    Overall is in school and shares challenges focusing and juggling tasks at times but states likely situational      4/24-Pt presents for 6 week f/u. She was started on tirzepatide  from compounded pharmacy. She is going on week 4-5.   She denies changes in bowel habits, nausea, vomiting. She denies issues at injection site.   Prior weight:269, weight from home 259.    24 hour diet recall:  Breakfast: bacon, scrambled eggs, biscuit, coffee from work, creamer, no sugar  Snack:none  Lunch: grilled chicken salad, lemonade with tea  Snack: none  Dinner:none  Patient Active Problem List   Diagnosis    Acute conjunctivitis of both eyes    Hx of preterm delivery, currently pregnant, third trimester    Previous cesarean delivery affecting pregnancy    Twin pregnancy, dichorionic/diamniotic, third trimester    Iron  deficiency anemia    Class 3 severe obesity without serious comorbidity with body mass index (BMI) of 40.0 to 44.9 in adult [facility])    Uterine size-date discrepancy, antepartum    Uses oral contraceptives    Suprapubic pain    Regular astigmatism    Routine postpartum follow-up    Myopia    Mother currently breast-feeding    Diastasis of muscle    Chlamydial infection    Bone and joint disorder of maternal back, pelvis, and lower limbs, antepartum    Atopic dermatitis    Anemia due to chronic blood loss    Anemia    Allergies    Allergic reaction    Acne      Past Medical History:   Diagnosis Date    Iron  deficiency anemia     Pregnancy induced  hypertension       Past Surgical History:   Procedure Laterality Date    CESAREAN SECTION      x4       Social History     Socioeconomic History    Marital status: Married     Spouse name: Not on file    Number of children: Not on file    Years of education: Not on file    Highest education level: Not on file   Occupational History    Not on file   Tobacco Use    Smoking status: Never    Smokeless tobacco: Never   Vaping Use    Vaping status: Never Used   Substance and Sexual Activity    Alcohol use: Not Currently    Drug use: Never    Sexual activity: Not on file   Other Topics Concern    Not on file   Social History Narrative    Married    Lives with 4 kids, husband, parents, brother    Occupation Facilities Manager     Social Determinants of Health     Financial Resource Strain: Low Risk  (07/03/2019)    Received from [facility], [facility]    Overall Financial Resource Strain (CARDIA)  Difficulty of Paying Living Expenses: Not hard at all   Food Insecurity: No Food Insecurity (07/03/2019)    Received from [facility], [facility]    Hunger Vital Sign     Worried About Running Out of Food in the Last Year: Never true     Ran Out of Food in the Last Year: Never true   Transportation Needs: No Transportation Needs (07/03/2019)    Received from [facility], [facility]    [facility] - Transportation     Lack of Transportation (Medical): No     Lack of Transportation (Non-Medical): No   Physical Activity: Not on file   Stress: Not on file   Social Connections: Not on file   Intimate Partner Violence: Not on file   Housing Stability: Not on file      Family History   Problem Relation Age of Onset    Hypertension Mother     Hypertension Father     Other Sister         autoimmune disease    Autism Brother     Diabetes Paternal Grandfather     Other Daughter         neurological issues; mitochondral 4 deficiency    Breast Cancer Neg Hx     Prostate Cancer Neg Hx     Colon Cancer Neg Hx          Outpatient Encounter Medications  as of 07/11/2023   Medication Sig Dispense Refill    doxycycline hyclate (VIBRAMYCIN) 100 MG capsule Take 1 capsule by mouth daily      Tirzepatide -Weight Management 2.5 MG/0.5ML SOAJ Inject 2.5 mg into the skin every 7 days 2 mL 0     No facility-administered encounter medications on file as of 07/11/2023.      Allergies   Allergen Reactions    Guaifenesin Nausea Only           OBJECTIVE:  Additional Measurements    07/11/23 0907   Waist (Inches): 45 in           PHYSICAL EXAM:  General: Well-nourished, well-developed individual, NAD.  Pulmonary: Symmetric chest lift, CTA B/L, no wheezes, rhonchi, rales on auscultation.   GI: Active bowel sounds x4 quadrants, soft, NTND abdomen  Extremities: no deformities, ambulates with no weakness bilaterally on inspection, no varicosities, no cyanosis or edema.   Neuro: AAOx3, pleasant affect.  Skin: no rashes or lesions.    IMAGES and LABS  No results found for this visit on 07/11/23.   No results found for this visit on 07/11/23.     ASSESSMENT &PLAN   Diagnosis Orders   1. Class 3 severe obesity without serious comorbidity with body mass index (BMI) of 40.0 to 44.9 in adult, unspecified obesity type (HCC)  Tirzepatide -Weight Management 2.5 MG/0.5ML SOAJ    Routine Venipuncture (63584)      2. Encounter for weight management  Tirzepatide -Weight Management 2.5 MG/0.5ML SOAJ    Routine Venipuncture (63584)      3. Prediabetes Controlled Hemoglobin A1C    Hemoglobin A1C    Routine Venipuncture (63584)    last A1c 6.1. will get updated lab work, can discuss treating      4. Iron  deficiency anemia secondary to inadequate dietary iron  intake Uncertain Status Cbc With Auto Differential    Iron  And TIBC    Ferritin    Ferritin    Iron  And TIBC    Cbc With Auto Differential    Routine Venipuncture (63584)  prior history of iron  infusions, labs pending              **17 minutes was spent in face to face screening for obesity, dietary and behavioral counseling        SMART GOALS:  Will  do in follow up     Obesity -from physical activity guidelines  at least 150 minutes (2 hours and 30 minutes) to 300 minutes (5 hours) a week of moderate-intensity, or 75 minutes (1 hour and 15 minutes) to 150 minutes (2 hours and 30 minutes) a week of vigorous-intensity aerobic physical activity, or mixture of both.     do muscle-strengthening activities of moderate or greater intensity and that involve all major muscle groups on 2 or more days a week  Discuss food label reading  Discussed portion control   Discussed adequate water intake 8-8 oz daily  Chair exercises: [employer] Youtube [employer]-  Foods that help: beans, chickpeas, lentils, vegetables, whole grains such as oats barley brown rice, nuts, seeds, whole fruits)  Foods that make things worse:   red meats (beef pork) processed meats  White rice, white bread  Sodas, juices  Fried foods    Stress reduction-learn about relaxation techniques, mindfulness, listening to music, journaling   Sleep health- [email or web address] and medline plus          An electronic signature was used to authenticate this note.    --Shoshana JONELLE Dawson, APRN - NP

## 2023-07-12 LAB — HEMOGLOBIN A1C
Estimated Avg Glucose: 126
Estimated Avg Glucose: 136
Hemoglobin A1C: 6 % (ref 4.0–6.0)

## 2023-07-13 NOTE — Addendum Note (Signed)
 Addended by: Alita Chyle on: 07/13/2023 01:55 PM     Modules accepted: Orders

## 2023-07-13 NOTE — Other (Signed)
Sent referral and messaged pt

## 2023-07-24 ENCOUNTER — Encounter

## 2023-07-24 MED ORDER — TIRZEPATIDE-WEIGHT MANAGEMENT 2.5 MG/0.5ML SC SOAJ
2.5 MG/0.5ML | SUBCUTANEOUS | 0 refills | Status: DC
Start: 2023-07-24 — End: 2023-08-10

## 2023-07-24 NOTE — Telephone Encounter (Signed)
Sent in medication

## 2023-08-10 ENCOUNTER — Encounter
Admit: 2023-08-10 | Discharge: 2023-08-10 | Payer: PRIVATE HEALTH INSURANCE | Attending: Family | Primary: Family Medicine

## 2023-08-10 DIAGNOSIS — Z7689 Persons encountering health services in other specified circumstances: Secondary | ICD-10-CM

## 2023-08-10 MED ORDER — TIRZEPATIDE-WEIGHT MANAGEMENT 5 MG/0.5ML SC SOLN
5 MG/0.ML | SUBCUTANEOUS | 0 refills | Status: DC
Start: 2023-08-10 — End: 2023-09-11

## 2023-08-10 MED ORDER — TIRZEPATIDE-WEIGHT MANAGEMENT 5 MG/0.5ML SC SOLN
5 | SUBCUTANEOUS | 0 refills | Status: DC
Start: 2023-08-10 — End: 2023-08-10

## 2023-08-10 NOTE — Progress Notes (Signed)
Subjective    HPI:    Kristy Brown (DOB:  September 05, 1986) is a 37 y.o. female,Established patient, here for evaluation of the following chief complaint(s):  Follow-up and Medication Refill    Pt presents for weight loss f/u  Was recently re-started on zepbound, compound version x 3 weeks. Denies side effects      24 hour diet recall/typical day:  Breakfast: chicken minis x4 from chick fila, orange juice  Lunch:none  Snack: pistachios  Dinner: cube steak, rice with cabbage  Snack: no    Exercise: none    07/11/23  Presents for overdue f/u weight loss management. Was on compounded tirzepatide and did not come back in office for refill since may. She has lost 13 lbs since then in addition to prior weight loss. Denies abdominal pain, Gi discomfort, n/v/d     Overall is in school and shares challenges focusing and juggling tasks at times but states likely situational        4/24-Pt presents for 6 week f/u. She was started on tirzepatide from compounded pharmacy. She is going on week 4-5.   She denies changes in bowel habits, nausea, vomiting. She denies issues at injection site.   Prior weight:269, weight from home 259.     24 hour diet recall:  Breakfast: bacon, scrambled eggs, biscuit, coffee from work, creamer, no sugar  Snack:none  Lunch: grilled chicken salad, lemonade with tea  Snack: none  Dinner:none    Patient Active Problem List   Diagnosis    Acute conjunctivitis of both eyes    Hx of preterm delivery, currently pregnant, third trimester    Previous cesarean delivery affecting pregnancy    Twin pregnancy, dichorionic/diamniotic, third trimester    Iron deficiency anemia    Class 3 severe obesity without serious comorbidity with body mass index (BMI) of 40.0 to 44.9 in adult    Uterine size-date discrepancy, antepartum    Uses oral contraceptives    Suprapubic pain    Regular astigmatism    Routine postpartum follow-up    Myopia    Mother currently breast-feeding    Diastasis of muscle    Chlamydial infection     Bone and joint disorder of maternal back, pelvis, and lower limbs, antepartum    Atopic dermatitis    Anemia due to chronic blood loss    Anemia    Allergies    Allergic reaction    Acne      Past Medical History:   Diagnosis Date    Iron deficiency anemia     Pregnancy induced hypertension       Past Surgical History:   Procedure Laterality Date    CESAREAN SECTION      x4       Social History     Socioeconomic History    Marital status: Married     Spouse name: Not on file    Number of children: Not on file    Years of education: Not on file    Highest education level: Not on file   Occupational History    Not on file   Tobacco Use    Smoking status: Never    Smokeless tobacco: Never   Vaping Use    Vaping status: Never Used   Substance and Sexual Activity    Alcohol use: Not Currently    Drug use: Never    Sexual activity: Not on file   Other Topics Concern    Not on file  Social History Narrative    Married    Lives with 4 kids, husband, parents, brother    Occupation Facilities manager     Social Determinants of Health     Financial Resource Strain: Low Risk  (07/03/2019)    Received from [facility], [facility]    Overall Financial Resource Strain (CARDIA)     Difficulty of Paying Living Expenses: Not hard at all   Food Insecurity: No Food Insecurity (07/03/2019)    Received from [facility], [facility]    Hunger Vital Sign     Worried About Running Out of Food in the Last Year: Never true     Ran Out of Food in the Last Year: Never true   Transportation Needs: No Transportation Needs (07/03/2019)    Received from [facility], [facility]    [facility] - Transportation     Lack of Transportation (Medical): No     Lack of Transportation (Non-Medical): No   Physical Activity: Not on file   Stress: Not on file   Social Connections: Not on file   Intimate Partner Violence: Not on file   Housing Stability: Not on file      Family History   Problem Relation Age of Onset    Hypertension Mother     Hypertension Father      Other Sister         autoimmune disease    Autism Brother     Diabetes Paternal Grandfather     Other Daughter         neurological issues; mitochondral 4 deficiency    Breast Cancer Neg Hx     Prostate Cancer Neg Hx     Colon Cancer Neg Hx          Outpatient Encounter Medications as of 08/10/2023   Medication Sig Dispense Refill    Tirzepatide-Weight Management 5 MG/0.5ML SOLN Inject 0.5 mLs into the skin every 7 days 2 mL 0    [DISCONTINUED] Tirzepatide-Weight Management 5 MG/0.5ML SOLN Inject 0.5 mLs into the skin every 7 days 2 mL 0    [DISCONTINUED] Tirzepatide-Weight Management 2.5 MG/0.5ML SOAJ Inject 2.5 mg into the skin every 7 days 2 mL 0    [DISCONTINUED] doxycycline hyclate (VIBRAMYCIN) 100 MG capsule Take 1 capsule by mouth daily       No facility-administered encounter medications on file as of 08/10/2023.      Allergies   Allergen Reactions    Guaifenesin Nausea Only         OBJECTIVE:  Additional Measurements    08/10/23 1058   Waist (Inches): 45 in     Vitals:    08/10/23 1053   BP: 122/84   Pulse: 64   Temp: 97.8 F (36.6 C)   SpO2: 98%   Weight: 113.9 kg (251 lb)            PHYSICAL EXAM:  General: Well-nourished, well-developed individual, NAD.  HEENT: Normocephalic, white sclera, no conjunctival injection, PERRL, pearly gray TM B/L, no nasal discharge, nasal septum midline, throat without lesions or exudates, clean dentition .No lymphadenopathy  Cardiac: RRR, S1 and S2 identified, no M/G/R or S3, S4on auscultation.  Pulmonary: Symmetric chest lift, CTA B/L, no wheezes, rhonchi, rales on auscultation.   GI: Active bowel sounds x4 quadrants, soft, NTND abdomen  Extremities: no deformities, ambulates with no weakness bilaterally on inspection, no varicosities, no cyanosis or edema.   Neuro: AAOx3, pleasant affect.  Skin: no rashes or lesions.  IMAGES and LABS  No results found for this visit on 08/10/23.   No results found for this visit on 08/10/23.     ASSESSMENT &PLAN   Diagnosis Orders   1.  Encounter for weight management        2. Class 3 severe obesity without serious comorbidity with body mass index (BMI) of 40.0 to 44.9 in adult, unspecified obesity type Inadequately Controlled Tirzepatide-Weight Management 5 MG/0.5ML SOLN    DISCONTINUED: Tirzepatide-Weight Management 5 MG/0.5ML SOLN    protein 120-125 g daily, increase water intake, goals set for exercise. will increase medication      3. Encounter for administration of vaccine  Influenza, FLUCELVAX Trivalent, (age 67 mo+) IM, Preservative Free, 0.40mL              **18 minutes was spent in face to face screening for obesity, dietary and behavioral counseling        SMART GOALS:    Pt will start home exercise program November 1 with walking treadmill  and dumbells  She will discuss days with her spouse to help her with accoutability and put start date in phone calendar       Obesity -from physical activity guidelines  at least 150 minutes (2 hours and 30 minutes) to 300 minutes (5 hours) a week of moderate-intensity, or 75 minutes (1 hour and 15 minutes) to 150 minutes (2 hours and 30 minutes) a week of vigorous-intensity aerobic physical activity, or mixture of both.     do muscle-strengthening activities of moderate or greater intensity and that involve all major muscle groups on 2 or more days a week  Discuss food label reading  Discussed portion control   Discussed adequate water intake 8-8 oz daily  Chair exercises: [employer]ging Youtube [employer]-  Foods that help: beans, chickpeas, lentils, vegetables, whole grains such as oats barley brown rice, nuts, seeds, whole fruits)  Foods that make things worse:   red meats (beef pork) processed meats  White rice, white bread  Sodas, juices  Fried foods    Stress reduction-learn about relaxation techniques, mindfulness, listening to music, journaling   Sleep health- [email or web address] and medline plus            An electronic signature was used to authenticate this  note.    --Sherren Kerns, APRN - NP

## 2023-09-11 ENCOUNTER — Encounter: Admit: 2023-09-11 | Payer: PRIVATE HEALTH INSURANCE | Admitting: Family | Primary: Family Medicine

## 2023-09-11 VITALS — BP 110/72 | HR 83 | Ht 66.0 in | Wt 245.0 lb

## 2023-09-11 DIAGNOSIS — E66813 Obesity, class 3: Secondary | ICD-10-CM

## 2023-09-11 DIAGNOSIS — Z6841 Body Mass Index (BMI) 40.0 and over, adult: Secondary | ICD-10-CM

## 2023-09-11 MED ORDER — TIRZEPATIDE-WEIGHT MANAGEMENT 7.5 MG/0.5ML SC SOAJ
7.50.5 MG/0.5ML | SUBCUTANEOUS | 0 refills | Status: AC
Start: 2023-09-11 — End: 2023-10-16

## 2023-09-11 NOTE — Progress Notes (Signed)
 Subjective    HPI:    Kristy Brown (DOB:  02-10-1986) is a 37 y.o. female,Established patient, here for evaluation of the following chief complaint(s):  Follow-up (4 weeks)      Pt presents for follow up weight loss.  Is currently on compound tirzepati

## 2023-10-16 ENCOUNTER — Ambulatory Visit
Admit: 2023-10-16 | Discharge: 2023-10-16 | Payer: PRIVATE HEALTH INSURANCE | Attending: Family | Admitting: Family | Primary: Family Medicine

## 2023-10-16 VITALS — BP 116/84 | HR 68 | Ht 66.0 in | Wt 241.0 lb

## 2023-10-16 DIAGNOSIS — Z6841 Body Mass Index (BMI) 40.0 and over, adult: Principal | ICD-10-CM

## 2023-10-16 DIAGNOSIS — E66813 Obesity, class 3: Secondary | ICD-10-CM

## 2023-10-16 MED ORDER — TIRZEPATIDE-WEIGHT MANAGEMENT 10 MG/0.5ML SC SOAJ
100.5 MG/0.5ML | SUBCUTANEOUS | 0 refills | Status: AC
Start: 2023-10-16 — End: 2023-11-07

## 2023-10-16 NOTE — Progress Notes (Signed)
 Subjective    HPI:    Kristy Brown (DOB:  [birth year]) is a 37 y.o. female,Established patient, here for evaluation of the following chief complaint(s):  Follow-up (5 weeks)      Pt presents for weight loss f/u  Shares at home was 230's which was great,

## 2023-10-27 ENCOUNTER — Encounter

## 2023-11-06 ENCOUNTER — Ambulatory Visit
Admit: 2023-11-06 | Discharge: 2023-11-06 | Payer: PRIVATE HEALTH INSURANCE | Attending: Hematology & Oncology | Primary: Family Medicine

## 2023-11-06 ENCOUNTER — Other Ambulatory Visit: Admit: 2023-11-06 | Discharge: 2023-11-06 | Payer: PRIVATE HEALTH INSURANCE | Primary: Family Medicine

## 2023-11-06 ENCOUNTER — Inpatient Hospital Stay: Admit: 2023-11-06 | Payer: PRIVATE HEALTH INSURANCE | Primary: Family Medicine

## 2023-11-06 VITALS — BP 119/81 | HR 64 | Ht 66.0 in | Wt 242.0 lb

## 2023-11-06 DIAGNOSIS — D649 Anemia, unspecified: Secondary | ICD-10-CM

## 2023-11-06 DIAGNOSIS — D500 Iron deficiency anemia secondary to blood loss (chronic): Secondary | ICD-10-CM

## 2023-11-06 LAB — CBC WITH AUTO DIFFERENTIAL
Basophils %: 0.5 % (ref 0.0–2.0)
Basophils Absolute: 0 10*3/uL (ref 0.0–0.2)
Eosinophils %: 6.2 % (ref 0.0–7.0)
Eosinophils Absolute: 0.3 10*3/uL (ref 0.0–0.5)
Hematocrit: 29.3 % — ABNORMAL LOW (ref 34.0–47.0)
Hemoglobin: 8.7 g/dL — ABNORMAL LOW (ref 11.5–15.7)
Lymphocytes Absolute: 1.5 10*3/uL (ref 1.0–3.2)
Lymphocytes: 36.8 % (ref 15.0–45.0)
MCH: 23.3 pg — ABNORMAL LOW (ref 27.0–34.5)
MCHC: 29.7 g/dL — ABNORMAL LOW (ref 30.0–36.0)
MCV: 78.3 fL — ABNORMAL LOW (ref 81.0–99.0)
MPV: 10.5 fL (ref 7.0–12.2)
Monocytes %: 9.8 10*3/uL (ref 4.0–12.0)
Monocytes Absolute: 0.4 10*3/uL (ref 0.3–1.0)
Neutrophils %: 46.7 % (ref 42.0–74.0)
Neutrophils Absolute: 2 10*3/uL (ref 1.6–7.3)
Platelets: 311 10*3/uL (ref 140–440)
RBC: 3.74 10*3/uL (ref 3.60–5.20)
RDW: 16.3 % (ref 10.0–17.0)
WBC: 4.2 10*3/uL (ref 3.8–10.6)

## 2023-11-06 LAB — IRON AND TIBC
Iron % Saturation: 5 % — ABNORMAL LOW (ref 20–40)
Iron: 17 ug/dL — ABNORMAL LOW (ref 37–145)
TIBC: 362 ug/dL (ref 250–450)
UIBC: 345 ug/dL (ref 112.0–347.0)

## 2023-11-06 LAB — FERRITIN: Ferritin: 5.5 ng/mL — ABNORMAL LOW (ref 13.0–150.0)

## 2023-11-06 NOTE — Progress Notes (Signed)
Progress Note -- Hematology/ Medical Oncology    Patient Name: Kristy Brown Attending: Linward Foster, MD   DOB: [birth year]  Age:38 y.o. IRC:VELFYB, Amy C, DO    Date of Visit: 11/06/2023        Chief Complaint     Chief Complaint   Patient presents with    Follow-up   Iron deficiency anemia     Hematologic/Oncologic History   Oral iron   IV ferric carboxymaltose 750mg  x 2 doses 06/2021    History of Present Illness   Kristy Brown returns today for follow up.  She is a very nice 38 year old female who we last saw in September 2022.  She has a history of iron deficiency anemia thought to be due to heavy menstrual cycles.  She recently started to feel more fatigued started having pica in the form of ice eating and her primary care checked her blood work showing that she was more anemic.  Today she is down to 8.7.  She feels very tired.  She is tried oral iron in the past and this has not been effective.  She did respond when we gave her iron back in 2022.    Past Medical History         Past Medical History:   Diagnosis Date    Iron deficiency anemia     Pregnancy induced hypertension         Past Surgical History     Past Surgical History:   Procedure Laterality Date    CESAREAN SECTION      x4          Allergies     Allergies   Allergen Reactions    Guaifenesin Nausea Only       Review of Systems   As mentioned in the history of present illness. All remaining 10 point systems were reviewed and remain negative.     Medications     Current Outpatient Medications   Medication Sig Dispense Refill    tirzepatide-weight management (ZEPBOUND) 10 MG/0.5ML SOAJ subCUTAneous auto-injector pen Inject 10 mg into the skin every 7 days 2 mL 0     No current facility-administered medications for this visit.       Physical Exam   BP 119/81   Pulse 64   Ht 1.676 m (5\' 6" )   Wt 109.8 kg (242 lb)   BMI 39.06 kg/m      This is a 38 y.o. female patient in no acute distress.   She  answers my questions  appropriately.  She is awake, alert and oriented.   HEENT: Normal conjunctiva and lids. Oropharynx unremarkable, Neck without cervical or supraclavicular adenopathy or mass.   Pulmonary: Symmetric expansion, no accessory muscle use, no distress  Cardiovascular:  RRR, no peripheral edema  Skin: no rash, no petechiae, no ecchymosis, no pallor  Musculoskeletal: normal muscle strength  Neurological: no focal deficit, normal gait, alert & oriented x 3       Labs/Imaging:     Lab Results   Component Value Date/Time    WBC 4.2 11/06/2023 08:41 AM    HGB 8.7 11/06/2023 08:41 AM    HCT 29.3 11/06/2023 08:41 AM    PLT 311 11/06/2023 08:41 AM    MCV 78.3 11/06/2023 08:41 AM       Lab Results   Component Value Date/Time    NA 140 11/17/2022 08:27 AM    K 4.4 11/17/2022 08:27 AM    CL 105 11/17/2022 08:27  AM    CO2 28 11/17/2022 08:27 AM    BUN 8 11/17/2022 08:27 AM    GLOB 2.5 11/17/2022 08:27 AM    ALT 13 11/17/2022 08:27 AM         Visit Diagnoses     1. Iron deficiency anemia due to chronic blood loss    2. Anemia, unspecified type [D64.9]          ASSESSMENT AND PLAN   38 year old female with recurrent iron deficiency anemia due to menorrhagia.  Will proceed with another round of IV iron given that she is symptomatic and does not respond to oral iron.  I have encouraged her to follow-up with her gynecologist to talk about options to slow or stop her menstrual cycle.  She does not desire any more children.    Schedule IV iron next available  RTC in 3 months for re check of blood work  Orders Placed This Encounter    Iron and TIBC     Standing Status:   Future     Standing Expiration Date:   11/05/2024    Ferritin     Standing Status:   Future     Standing Expiration Date:   11/05/2024    Iron and TIBC     Standing Status:   Future     Standing Expiration Date:   11/05/2024    Ferritin     Standing Status:   Future     Standing Expiration Date:   11/05/2024    CBC with Auto Differential     Standing Status:   Future     Standing  Expiration Date:   11/05/2024

## 2023-11-07 ENCOUNTER — Ambulatory Visit
Admit: 2023-11-07 | Discharge: 2023-11-07 | Payer: PRIVATE HEALTH INSURANCE | Attending: Family | Primary: Family Medicine

## 2023-11-07 VITALS — BP 120/70 | HR 82 | Temp 97.80000°F | Wt 239.0 lb

## 2023-11-07 DIAGNOSIS — E66813 Obesity, class 3: Secondary | ICD-10-CM

## 2023-11-07 MED ORDER — TIRZEPATIDE-WEIGHT MANAGEMENT 10 MG/0.5ML SC SOAJ
100.5 MG/0.5ML | SUBCUTANEOUS | 0 refills | Status: DC
Start: 2023-11-07 — End: 2024-01-09

## 2023-11-07 NOTE — Progress Notes (Signed)
Subjective    HPI:    Kristy Brown (DOB:  31-Jul-1986) is a 38 y.o. female,Established patient, here for evaluation of the following chief complaint(s):  Follow-up      Pt presents for 4 week f/u of zepbound to 10 mg   Shares no GI upset  Has noticed change in appetite       09/2023-Pt presents for weight loss f/u  Shares at home was 230's which was great, due to holiday season did have some changes in dietary choices which slowed down progress some. Also had picked up work shifts which changed her exercise schedule  Today weighs 241 lbs     11/24  Pt presents for follow up weight loss.  Is currently on compound tirzepatide and denies HA, GI side effects,chest pain, chest tightness, SOB  She has increased her protein and servings of fiber most days  She did start her gym routine      10/24  Pt presents for weight loss f/u  Was recently re-started on zepbound, compound version x 3 weeks. Denies side effects       24 hour diet recall/typical day:  Breakfast: chicken minis x4 from chick fila, orange juice  Lunch:none  Snack: pistachios  Dinner: cube steak, rice with cabbage  Snack: no     Exercise: none     07/11/23  Presents for overdue f/u weight loss management. Was on compounded tirzepatide and did not come back in office for refill since may. She has lost 13 lbs since then in addition to prior weight loss. Denies abdominal pain, Gi discomfort, n/v/d     Overall is in school and shares challenges focusing and juggling tasks at times but states likely situational        4/24-Pt presents for 6 week f/u. She was started on tirzepatide from compounded pharmacy. She is going on week 4-5.   She denies changes in bowel habits, nausea, vomiting. She denies issues at injection site.   Prior weight:269, weight from home 259.     24 hour diet recall:  Breakfast: bacon, scrambled eggs, biscuit, coffee from work, creamer, no sugar  Snack:none  Lunch: grilled chicken salad, lemonade with tea  Snack:  none  Dinner:none      Patient Active Problem List   Diagnosis    Acute conjunctivitis of both eyes    Hx of preterm delivery, currently pregnant, third trimester    Previous cesarean delivery affecting pregnancy    Twin pregnancy, dichorionic/diamniotic, third trimester    Iron deficiency anemia    Class 3 severe obesity without serious comorbidity with body mass index (BMI) of 40.0 to 44.9 in adult    Uterine size-date discrepancy, antepartum    Uses oral contraceptives    Suprapubic pain    Regular astigmatism    Routine postpartum follow-up    Myopia    Mother currently breast-feeding    Diastasis of muscle    Chlamydial infection    Bone and joint disorder of maternal back, pelvis, and lower limbs, antepartum    Atopic dermatitis    Anemia due to chronic blood loss    Anemia    Allergies    Allergic reaction    Acne    Body mass index (BMI) 40.0-44.9, adult    Umbilical hernia      Past Medical History:   Diagnosis Date    Iron deficiency anemia     Pregnancy induced hypertension       Past Surgical History:  Procedure Laterality Date    CESAREAN SECTION      x4       Social History     Socioeconomic History    Marital status: Married     Spouse name: Not on file    Number of children: Not on file    Years of education: Not on file    Highest education level: Not on file   Occupational History    Not on file   Tobacco Use    Smoking status: Never    Smokeless tobacco: Never   Vaping Use    Vaping status: Never Used   Substance and Sexual Activity    Alcohol use: Not Currently    Drug use: Never    Sexual activity: Not on file   Other Topics Concern    Not on file   Social History Narrative    Married    Lives with 4 kids, husband, parents, brother    Occupation Facilities manager     Social Determinants of Health     Financial Resource Strain: Low Risk  (07/03/2019)    Received from [facility], [facility]    Overall Financial Resource Strain (CARDIA)     Difficulty of Paying Living Expenses: Not hard at all   Food  Insecurity: No Food Insecurity (07/03/2019)    Received from [facility], [facility]    Hunger Vital Sign     Worried About Running Out of Food in the Last Year: Never true     Ran Out of Food in the Last Year: Never true   Transportation Needs: No Transportation Needs (07/03/2019)    Received from [facility], [facility]    [facility] - Transportation     Lack of Transportation (Medical): No     Lack of Transportation (Non-Medical): No   Physical Activity: Not on file   Stress: Not on file   Social Connections: Not on file   Intimate Partner Violence: Not on file   Housing Stability: Not on file      Family History   Problem Relation Age of Onset    Hypertension Mother     Hypertension Father     Other Sister         autoimmune disease    Autism Brother     Diabetes Paternal Grandfather     Other Daughter         neurological issues; mitochondral 4 deficiency    Breast Cancer Neg Hx     Prostate Cancer Neg Hx     Colon Cancer Neg Hx          Outpatient Encounter Medications as of 11/07/2023   Medication Sig Dispense Refill    tirzepatide-weight management (ZEPBOUND) 10 MG/0.5ML SOAJ subCUTAneous auto-injector pen Inject 10 mg into the skin every 7 days 2 mL 0    [DISCONTINUED] tirzepatide-weight management (ZEPBOUND) 10 MG/0.5ML SOAJ subCUTAneous auto-injector pen Inject 10 mg into the skin every 7 days 2 mL 0     No facility-administered encounter medications on file as of 11/07/2023.      Allergies   Allergen Reactions    Guaifenesin Nausea Only             OBJECTIVE:  Additional Measurements    11/07/23 0922   Waist (Inches): 47 in           PHYSICAL EXAM:  General: Well-nourished, well-developed individual, NAD.  Cardiac: RRR, S1 and S2 identified, no M/G/R or S3, S4on  auscultation.  Pulmonary: Symmetric chest lift, CTA B/L, no wheezes, rhonchi, rales on auscultation.   GI: Active bowel sounds x4 quadrants, soft, NTND abdomen  Extremities: no deformities, ambulates with no weakness bilaterally on inspection, no  varicosities, no cyanosis or edema.   Neuro: AAOx3, pleasant affect.  Skin: no rashes or lesions.      ASSESSMENT &PLAN   Diagnosis Orders   1. Class 3 severe obesity without serious comorbidity with body mass index (BMI) of 40.0 to 44.9 in adult, unspecified obesity type Inadequately Controlled tirzepatide-weight management (ZEPBOUND) 10 MG/0.5ML SOAJ subCUTAneous auto-injector pen    has lost addional 4 lbs since last visit. tolerates medication. continue with exercise, portion sizes, reviewed holiday eating habits      2. Iron deficiency anemia due to chronic blood loss Inadequately Controlled POC Fecal Hemoccult (02725)    External Referral To OB-GYN    f/u with heme and has IV infusions starting soon      3. Menorrhagia with regular cycle  External Referral To OB-GYN    will refer to OBGYN as well for options in management              **17 minutes was spent in face to face screening for obesity, dietary and behavioral counseling             Obesity -from physical activity guidelines  at least 150 minutes (2 hours and 30 minutes) to 300 minutes (5 hours) a week of moderate-intensity, or 75 minutes (1 hour and 15 minutes) to 150 minutes (2 hours and 30 minutes) a week of vigorous-intensity aerobic physical activity, or mixture of both.     do muscle-strengthening activities of moderate or greater intensity and that involve all major muscle groups on 2 or more days a week  Discuss food label reading  Discussed portion control   Discussed adequate water intake 8-8 oz daily  Chair exercises: [employer]-  Foods that help: beans, chickpeas, lentils, vegetables, whole grains such as oats barley brown rice, nuts, seeds, whole fruits)  Foods that make things worse:   red meats (beef pork) processed meats  White rice, white bread  Sodas, juices  Fried foods    Stress reduction-learn about relaxation techniques, mindfulness, listening to music, journaling   Sleep health-  [email or web address] and medline plus          An electronic signature was used to authenticate this note.    --Sherren Kerns, APRN - NP

## 2023-11-20 ENCOUNTER — Inpatient Hospital Stay: Admit: 2023-11-20 | Discharge: 2023-11-20 | Payer: PRIVATE HEALTH INSURANCE | Primary: Family Medicine

## 2023-11-20 VITALS — BP 109/65 | HR 84 | Temp 98.10000°F | Resp 16 | Wt 231.2 lb

## 2023-11-20 DIAGNOSIS — D500 Iron deficiency anemia secondary to blood loss (chronic): Secondary | ICD-10-CM

## 2023-11-20 MED ORDER — IRON SUCROSE 300 MG IN NS 250 ML IVPB
Freq: Once | Status: AC
Start: 2023-11-20 — End: 2023-11-20
  Administered 2023-11-20: 14:00:00 300 mg via INTRAVENOUS

## 2023-11-20 MED ORDER — NORMAL SALINE FLUSH 0.9 % IV SOLN
0.9 | INTRAVENOUS | Status: DC | PRN
Start: 2023-11-20 — End: 2023-11-21
  Administered 2023-11-20 (×2): 10 mL via INTRAVENOUS

## 2023-11-20 MED FILL — IRON SUCROSE 20 MG/ML IV SOLN: 20 MG/ML | INTRAVENOUS | Qty: 300

## 2023-11-20 NOTE — Plan of Care (Signed)
Patient educated on today's treatment plan   Venofer and continuation of care. All questions and concerns addressed and patient is satisfied with outcome.     Next appointment information given to [facility] (scheduler) to return 12/04/23.     Knowledge deficit care plan type treatment plan, self-care needs, and reportable signs and symptoms was reviewed with the patient and the outcome was reinforced and ongoing plan of care.

## 2023-12-04 ENCOUNTER — Inpatient Hospital Stay: Payer: PRIVATE HEALTH INSURANCE | Primary: Family Medicine

## 2023-12-04 ENCOUNTER — Inpatient Hospital Stay: Admit: 2023-12-04 | Discharge: 2023-12-04 | Payer: PRIVATE HEALTH INSURANCE | Primary: Family Medicine

## 2023-12-04 VITALS — BP 124/83 | HR 72 | Temp 97.90000°F | Resp 16 | Wt 228.3 lb

## 2023-12-04 DIAGNOSIS — D500 Iron deficiency anemia secondary to blood loss (chronic): Secondary | ICD-10-CM

## 2023-12-04 MED ORDER — NORMAL SALINE FLUSH 0.9 % IV SOLN
0.9 | INTRAVENOUS | Status: DC | PRN
Start: 2023-12-04 — End: 2023-12-05
  Administered 2023-12-04: 16:00:00 10 mL via INTRAVENOUS
  Administered 2023-12-04: 15:00:00 20 mL via INTRAVENOUS

## 2023-12-04 MED ORDER — IRON SUCROSE 300 MG IN NS 250 ML IVPB
Freq: Once | Status: AC
Start: 2023-12-04 — End: 2023-12-04
  Administered 2023-12-04: 15:00:00 300 mg via INTRAVENOUS

## 2023-12-04 MED FILL — VENOFER 20 MG/ML IV SOLN: 20 MG/ML | INTRAVENOUS | Qty: 200

## 2023-12-18 ENCOUNTER — Encounter: Payer: PRIVATE HEALTH INSURANCE | Primary: Family Medicine

## 2023-12-18 ENCOUNTER — Inpatient Hospital Stay: Admit: 2023-12-18 | Discharge: 2023-12-18 | Payer: PRIVATE HEALTH INSURANCE | Primary: Family Medicine

## 2023-12-18 VITALS — BP 135/84 | HR 70 | Temp 96.50000°F | Resp 18 | Wt 228.5 lb

## 2023-12-18 DIAGNOSIS — D500 Iron deficiency anemia secondary to blood loss (chronic): Secondary | ICD-10-CM

## 2023-12-18 MED ORDER — SODIUM CHLORIDE 0.9 % IV SOLN
0.9 | Freq: Once | INTRAVENOUS | Status: AC
Start: 2023-12-18 — End: 2023-12-18
  Administered 2023-12-18: 14:00:00 400 mg via INTRAVENOUS

## 2023-12-18 MED ORDER — NORMAL SALINE FLUSH 0.9 % IV SOLN
0.9 | INTRAVENOUS | Status: DC | PRN
Start: 2023-12-18 — End: 2023-12-19
  Administered 2023-12-18 (×2): 10 mL via INTRAVENOUS

## 2023-12-18 MED FILL — VENOFER 20 MG/ML IV SOLN: 20 MG/ML | INTRAVENOUS | Qty: 20

## 2024-01-02 ENCOUNTER — Ambulatory Visit
Admit: 2024-01-02 | Discharge: 2024-01-02 | Payer: PRIVATE HEALTH INSURANCE | Attending: Obstetrics & Gynecology | Primary: Family Medicine

## 2024-01-02 VITALS — BP 131/85 | Ht 65.98 in | Wt 230.2 lb

## 2024-01-02 DIAGNOSIS — Z01419 Encounter for gynecological examination (general) (routine) without abnormal findings: Secondary | ICD-10-CM

## 2024-01-02 NOTE — Progress Notes (Signed)
 Kristy Brown (DOB:  11/26/1985) is a 38 y.o. female,New patient, here for evaluation of the following chief complaint(s):  New Patient (Pt gets iron infusions, just had her 3rd. Pt is present to discuss if she needs hysterectomy. Pt doesn't have c/o heavy painful cycles. )      Assessment & Plan   ASSESSMENT/PLAN:  1. Encounter for annual routine gynecological examination  2. Screening for malignant neoplasm of cervix  -     PAP IG, Aptima HPV and rfx 16/18,45 (132440)  ([facility] DEFAULT)  3. Screening for HPV (human papillomavirus)  -     PAP IG, Aptima HPV and rfx 16/18,45 (102725)  ([facility] DEFAULT)  4. Abnormal uterine bleeding (AUB)  -     US  NON OB TRANSVAGINAL; Future  Health maintenance reviewed. Pap smear. US  for bleeding      Return in about 2 weeks (around 01/16/2024) for US , Follow Up.         Subjective   SUBJECTIVE/OBJECTIVE:  HPI  38 y/o presents due to abnormal uterine bleeding. Heavy periods monthly lasting more than 7 days. Heavy using multiple pads. Severe proven anemia. Fatigue. Nothing makes it better or worse. Vaginal. Currently not on birth control    Allergies   Allergen Reactions    Guaifenesin Nausea Only     Current Outpatient Medications   Medication Sig Dispense Refill    tirzepatide-weight management (ZEPBOUND) 10 MG/0.5ML SOAJ subCUTAneous auto-injector pen Inject 10 mg into the skin every 7 days 2 mL 0     No current facility-administered medications for this visit.       Past Medical History:   Diagnosis Date    Iron deficiency anemia     Pregnancy induced hypertension      Past Surgical History:   Procedure Laterality Date    CESAREAN SECTION      x4     Social History     Socioeconomic History    Marital status: Married     Spouse name: Not on file    Number of children: Not on file    Years of education: Not on file    Highest education level: Not on file   Occupational History    Not on file   Tobacco Use    Smoking status: Never    Smokeless tobacco: Never   Vaping Use     Vaping status: Never Used   Substance and Sexual Activity    Alcohol use: Not Currently    Drug use: Never    Sexual activity: Not on file   Other Topics Concern    Not on file   Social History Narrative    Married    Lives with 4 kids, husband, parents, brother    Occupation Facilities manager     Social Drivers of Health     Financial Resource Strain: Low Risk  (07/03/2019)    Received from [facility], [facility]    Overall Financial Resource Strain (CARDIA)     Difficulty of Paying Living Expenses: Not hard at all   Food Insecurity: No Food Insecurity (07/03/2019)    Received from [facility], [facility]    Hunger Vital Sign     Worried About Running Out of Food in the Last Year: Never true     Ran Out of Food in the Last Year: Never true   Transportation Needs: No Transportation Needs (07/03/2019)    Received from [facility], [facility]    [facility] - Transportation  Lack of Transportation (Medical): No     Lack of Transportation (Non-Medical): No   Physical Activity: Not on file   Stress: Not on file   Social Connections: Not on file   Intimate Partner Violence: Not on file   Housing Stability: Not on file     Family History   Problem Relation Age of Onset    Hypertension Mother     Hypertension Father     Other Sister         autoimmune disease    Autism Brother     Diabetes Paternal Actor     Other Daughter         neurological issues; mitochondral 4 deficiency    Breast Cancer Neg Hx     Prostate Cancer Neg Hx     Colon Cancer Neg Hx        Review of Systems   Review of Systems - General ROS: negative  Breast ROS: negative for breast lumps  Respiratory ROS: no cough, shortness of breath, or wheezing  Cardiovascular ROS: no chest pain or dyspnea on exertion  Gastrointestinal ROS: no abdominal pain, change in bowel habits, or black or bloody stools  Genito-Urinary ROS: no dysuria, trouble voiding, or hematuria  Musculoskeletal ROS: negative      Objective   Blood pressure 131/85, height 1.676 m (5' 5.98"),  weight 104.4 kg (230 lb 3.2 oz), last menstrual period 12/27/2023.    Physical Exam   Physical Examination: General appearance - alert, well appearing, and in no distress  Chest - clear to auscultation, no wheezes, rales or rhonchi, symmetric air entry  Heart - normal rate, regular rhythm, normal S1, S2, no murmurs, rubs, clicks or gallops  Abdomen - soft, nontender, nondistended, no masses or organomegaly  Breasts - breasts appear normal, no suspicious masses, no skin or nipple changes or axillary nodes  Pelvic - normal external genitalia, vulva, vagina, cervix, uterus and adnexa  Musculoskeletal - no joint tenderness, deformity or swelling        An electronic signature was used to authenticate this note.    --Bard Leyland, MD

## 2024-01-04 LAB — PAP IG, APTIMA HPV AND RFX 16/18,45 (199305)
.: 0
HPV Aptima: NEGATIVE

## 2024-01-09 ENCOUNTER — Ambulatory Visit
Admit: 2024-01-09 | Discharge: 2024-01-09 | Payer: PRIVATE HEALTH INSURANCE | Attending: Family | Primary: Family Medicine

## 2024-01-09 ENCOUNTER — Encounter

## 2024-01-09 VITALS — BP 110/82 | HR 68 | Temp 98.00000°F | Wt 234.0 lb

## 2024-01-09 DIAGNOSIS — Z7689 Persons encountering health services in other specified circumstances: Secondary | ICD-10-CM

## 2024-01-09 MED ORDER — TIRZEPATIDE-WEIGHT MANAGEMENT 10 MG/0.5ML SC SOAJ
10 MG/0.5ML | SUBCUTANEOUS | 0 refills | Status: DC
Start: 2024-01-09 — End: 2024-02-12

## 2024-01-09 MED ORDER — TIRZEPATIDE-WEIGHT MANAGEMENT 10 MG/0.5ML SC SOAJ
100.5 | SUBCUTANEOUS | 0 refills | Status: DC
Start: 2024-01-09 — End: 2024-01-09

## 2024-01-09 NOTE — Progress Notes (Signed)
 Subjective    HPI:    Kristy Brown (DOB:  Feb 20, 1986) is a 38 y.o. female,Established patient, here for evaluation of the following chief complaint(s):  Follow-up (Weight loss and anemia - needs refill )    Pt presents for follow up  She shares February was good regarding diet, 224 lbs at home, 228 in office  She shares March is her birthday month and also was out of zepbound for a few weeks. During these weeks gained some weight back. 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 Will run and od exercises with the kids.   Was able to get in size 14 pants ; has lost 2 inches since Sept/oct 2024    11/07/23    Pt presents for 4 week f/u of zepbound to 10 mg   Shares no GI upset  Has noticed change in appetite        09/2023-Pt presents for weight loss f/u  Shares at home was 230's which was great, due to holiday season did have some changes in dietary choices which slowed down progress some. Also had picked up work shifts which changed her exercise schedule  Today weighs 241 lbs     11/24  Pt presents for follow up weight loss.  Is currently on compound tirzepatide and denies HA, GI side effects,chest pain, chest tightness, SOB  She has increased her protein and servings of fiber most days  She did start her gym routine      10/24  Pt presents for weight loss f/u  Was recently re-started on zepbound, compound version x 3 weeks. Denies side effects       24 hour diet recall/typical day:  Breakfast: chicken minis x4 from [employer], orange juice  Lunch:none  Snack: pistachios  Dinner: cube steak, rice with cabbage  Snack: no     Exercise: none     07/11/23  Presents for overdue f/u weight loss management. Was on compounded tirzepatide and did not come back in office for refill since may. She has lost 13 lbs since then in addition to prior weight loss. Denies abdominal pain, Gi discomfort, n/v/d     Overall is in school and shares challenges focusing and juggling tasks at times  but states likely situational        4/24-Pt presents for 6 week f/u. She was started on tirzepatide from compounded pharmacy. She is going on week 4-5.   She denies changes in bowel habits, nausea, vomiting. She denies issues at injection site.   Prior weight:269, weight from home 259.     24 hour diet recall:  Breakfast: bacon, scrambled eggs, biscuit, coffee from work, creamer, no sugar  Snack:none  Lunch: grilled chicken salad, lemonade with tea  Snack: none  Dinner:none     Patient Active Problem List   Diagnosis    Acute conjunctivitis of both eyes    Hx of preterm delivery, currently pregnant, third trimester    Previous cesarean delivery affecting pregnancy    Twin pregnancy, dichorionic/diamniotic, third trimester    Iron deficiency anemia    Class 3 severe obesity without serious comorbidity with body mass index (BMI) of 40.0 to 44.9 in adult    Uterine size-date discrepancy, antepartum    Uses oral contraceptives    Suprapubic pain    Regular astigmatism    Routine postpartum follow-up    Myopia    Mother currently breast-feeding    Diastasis of muscle  Chlamydial infection    Bone and joint disorder of maternal back, pelvis, and lower limbs, antepartum    Atopic dermatitis    Anemia due to chronic blood loss    Anemia    Allergies    Allergic reaction    Acne    Body mass index (BMI) 40.0-44.9, adult    Umbilical hernia      Past Medical History:   Diagnosis Date    Iron deficiency anemia     Pregnancy induced hypertension       Past Surgical History:   Procedure Laterality Date    CESAREAN SECTION      x4       Social History     Socioeconomic History    Marital status: Married     Spouse name: Not on file    Number of children: Not on file    Years of education: Not on file    Highest education level: Not on file   Occupational History    Not on file   Tobacco Use    Smoking status: Never    Smokeless tobacco: Never   Vaping Use    Vaping status: Never Used   Substance and Sexual Activity    Alcohol  use: Not Currently    Drug use: Never    Sexual activity: Not on file   Other Topics Concern    Not on file   Social History Narrative    Married    Lives with 4 kids, husband, parents, brother    Occupation Facilities manager     Social Drivers of Health     Financial Resource Strain: Low Risk  (07/03/2019)    Received from [facility], [facility]    Overall Financial Resource Strain (CARDIA)     Difficulty of Paying Living Expenses: Not hard at all   Food Insecurity: No Food Insecurity (07/03/2019)    Received from [facility], [facility]    Hunger Vital Sign     Worried About Running Out of Food in the Last Year: Never true     Ran Out of Food in the Last Year: Never true   Transportation Needs: No Transportation Needs (07/03/2019)    Received from [facility], [facility]    [facility] - Transportation     Lack of Transportation (Medical): No     Lack of Transportation (Non-Medical): No   Physical Activity: Not on file   Stress: Not on file   Social Connections: Not on file   Intimate Partner Violence: Not on file   Housing Stability: Not on file      Family History   Problem Relation Age of Onset    Hypertension Mother     Hypertension Father     Other Sister         autoimmune disease    Autism Brother     Diabetes Paternal Grandfather     Other Daughter         neurological issues; mitochondral 4 deficiency    Breast Cancer Neg Hx     Prostate Cancer Neg Hx     Colon Cancer Neg Hx          Outpatient Encounter Medications as of 01/09/2024   Medication Sig Dispense Refill    tirzepatide-weight management (ZEPBOUND) 10 MG/0.5ML SOAJ subCUTAneous auto-injector pen Inject 10 mg into the skin every 7 days 2 mL 0    [DISCONTINUED] tirzepatide-weight management (ZEPBOUND) 10 MG/0.5ML SOAJ subCUTAneous auto-injector pen  Inject 10 mg into the skin every 7 days 2 mL 0    [DISCONTINUED] tirzepatide-weight management (ZEPBOUND) 10 MG/0.5ML SOAJ subCUTAneous auto-injector pen Inject 10 mg into the skin every 7 days 2 mL 0     No  facility-administered encounter medications on file as of 01/09/2024.      Allergies   Allergen Reactions    Guaifenesin Nausea Only           OBJECTIVE:  Additional Measurements    01/09/24 0948   Waist (Inches): 43 in           PHYSICAL EXAM:  General: Well-nourished, well-developed individual, NAD.  Cardiac: RRR, S1 and S2 identified, no M/G/R or S3, S4on auscultation.  Pulmonary: Symmetric chest lift, CTA B/L, no wheezes, rhonchi, rales on auscultation.   GI: Active bowel sounds x4 quadrants, soft, NTND abdomen  Extremities: no deformities, ambulates with no weakness bilaterally on inspection, no varicosities, no cyanosis or edema.   Neuro: AAOx3, pleasant affect.  Skin: no rashes or lesions.    IMAGES and LABS  No results found for this visit on 01/09/24.   No results found for this visit on 01/09/24.     ASSESSMENT &PLAN   Diagnosis Orders   1. Encounter for weight management        2. Class 3 severe obesity without serious comorbidity with body mass index (BMI) of 40.0 to 44.9 in adult, unspecified obesity type Inadequately Controlled tirzepatide-weight management (ZEPBOUND) 10 MG/0.5ML SOAJ subCUTAneous auto-injector pen    DISCONTINUED: tirzepatide-weight management (ZEPBOUND) 10 MG/0.5ML SOAJ subCUTAneous auto-injector pen    had a set back, will send 10 mg in f/u consider increase. has lost 2 inches in waist so far              **17 minutes was spent in face to face screening for obesity, dietary and behavioral counseling          An electronic signature was used to authenticate this note.    --Sherren Kerns, APRN - NP

## 2024-02-05 ENCOUNTER — Ambulatory Visit
Admit: 2024-02-05 | Discharge: 2024-02-05 | Payer: PRIVATE HEALTH INSURANCE | Attending: Obstetrics & Gynecology | Primary: Family Medicine

## 2024-02-05 ENCOUNTER — Ambulatory Visit: Admit: 2024-02-05 | Discharge: 2024-02-05 | Payer: PRIVATE HEALTH INSURANCE | Primary: Family Medicine

## 2024-02-05 VITALS — BP 128/86 | Ht 65.98 in | Wt 237.0 lb

## 2024-02-05 DIAGNOSIS — N939 Abnormal uterine and vaginal bleeding, unspecified: Secondary | ICD-10-CM

## 2024-02-05 NOTE — Progress Notes (Signed)
 Kristy Brown (DOB:  1986/07/22) is a 38 y.o. female,Established patient, here for evaluation of the following chief complaint(s):  Follow-up (US  f/u)         Assessment & Plan  Abnormal uterine bleeding (AUB)  Ultrasound shows a 8 to 9 cm uterus with 1 to 4 cm fibroids.  Right 8 cm mass consistent with right hydrosalpinx.  Not sure if that the tube is patient by history says she has had her tubes removed.  However she is not in pain.  After extensive counseling plan for Mirena IUD placement.  Patient is not in any pain she just needs her bleeding improved to improve her hemoglobin.  Discussed potential if she feels the Mirena IUD for hysterectomy  C-section x 4  Intramural, submucous, and subserous leiomyoma of uterus      Hydrosalpinx       Return in about 1 week (around 02/12/2024), or mirena placement, for mirena placement.       Subjective   HPI  38 year old gravida 4 para 4 presents for ultrasound follow-up for severe anemia.  Her periods were heavy and last 5 days but she is really here because of severe anemia sent here by her heme oncologist.    Objective   Blood pressure 128/86, height 1.676 m (5' 5.98"), weight 107.5 kg (237 lb), last menstrual period 01/19/2024.      On this date 02/05/2024 I have spent 30 minutes reviewing previous notes, test results and face to face with the patient discussing the diagnosis and importance of compliance with the treatment plan as well as documenting on the day of the visit.      An electronic signature was used to authenticate this note.    --Bard Leyland, MD

## 2024-02-05 NOTE — Telephone Encounter (Signed)
 Can you please precert this patient for an iud insertion patient states she does not know which one she is getting yet . Thank you

## 2024-02-09 NOTE — Progress Notes (Signed)
 Patient seen on the mobile health unit for preventive care screenings.    A1C/Glucose screening : 4.6

## 2024-02-12 ENCOUNTER — Inpatient Hospital Stay: Admit: 2024-02-12 | Payer: PRIVATE HEALTH INSURANCE | Primary: Family Medicine

## 2024-02-12 ENCOUNTER — Ambulatory Visit
Admit: 2024-02-12 | Discharge: 2024-02-12 | Payer: PRIVATE HEALTH INSURANCE | Attending: Family | Primary: Family Medicine

## 2024-02-12 ENCOUNTER — Other Ambulatory Visit: Admit: 2024-02-12 | Discharge: 2024-02-12 | Payer: PRIVATE HEALTH INSURANCE | Primary: Family Medicine

## 2024-02-12 VITALS — BP 114/79 | HR 62 | Temp 98.10000°F | Ht 65.98 in | Wt 237.3 lb

## 2024-02-12 DIAGNOSIS — D500 Iron deficiency anemia secondary to blood loss (chronic): Secondary | ICD-10-CM

## 2024-02-12 DIAGNOSIS — D509 Iron deficiency anemia, unspecified: Secondary | ICD-10-CM

## 2024-02-12 LAB — IRON AND TIBC
Iron % Saturation: 13 % — ABNORMAL LOW (ref 20–40)
Iron: 40 ug/dL (ref 37–145)
TIBC: 304 ug/dL (ref 250–450)
UIBC: 264 ug/dL (ref 112.0–347.0)

## 2024-02-12 LAB — CBC WITH AUTO DIFFERENTIAL
Basophils %: 0.8 % (ref 0.0–2.0)
Basophils Absolute: 0 10*3/uL (ref 0.0–0.2)
Eosinophils %: 5.3 % (ref 0.0–7.0)
Eosinophils Absolute: 0.2 10*3/uL (ref 0.0–0.5)
Hematocrit: 33.8 % — ABNORMAL LOW (ref 34.0–47.0)
Hemoglobin: 10.5 g/dL — ABNORMAL LOW (ref 11.5–15.7)
Immature Grans (Abs): 0.01 10*3/uL (ref 0.00–0.06)
Immature Granulocytes %: 0.3 % (ref 0.0–0.6)
Lymphocytes Absolute: 1.4 10*3/uL (ref 1.0–3.2)
Lymphocytes: 36.5 % (ref 15.0–45.0)
MCH: 27.4 pg (ref 27.0–34.5)
MCHC: 31.1 g/dL (ref 30.0–36.0)
MCV: 88.3 fL (ref 81.0–99.0)
MPV: 10.9 fL (ref 7.0–12.2)
Monocytes %: 10.1 % (ref 4.0–12.0)
Monocytes Absolute: 0.4 10*3/uL (ref 0.3–1.0)
Neutrophils %: 47 % (ref 42.0–74.0)
Neutrophils Absolute: 1.9 10*3/uL (ref 1.6–7.3)
Platelets: 214 10*3/uL (ref 140–440)
RBC: 3.83 x10e6/mcL (ref 3.60–5.20)
RDW: 17.1 % — ABNORMAL HIGH (ref 10.0–17.0)
WBC: 4 10*3/uL (ref 3.8–10.6)

## 2024-02-12 LAB — FERRITIN: Ferritin: 16.2 ng/mL (ref 13.0–150.0)

## 2024-02-12 NOTE — Progress Notes (Signed)
 Progress Note -- Hematology/ Medical Oncology    Patient Name: Kristy Brown Attending: Mammie Sears, MD   DOB: [birth year]  Age:38 y.o. [id number], Amy C, DO    Date of Visit: 02/12/2024        Chief Complaint     Chief Complaint   Patient presents with    Follow-up   Iron  deficiency anemia     Hematologic/Oncologic History   Oral iron    IV ferric carboxymaltose 750mg  x 2 doses 06/2021  IV Venofer  12/2023    History of Present Illness   Kristy Brown returns today for follow up for a history of iron  deficiency anemia thought to be due to heavy menstrual cycles.  She recently had a course of IV iron  and hemoglobin did improve from 8.7 to 10.5. She saw Dr. Carlota Chestnut and he has recommended an IUD.     Past Medical History     Past Medical History:   Diagnosis Date    Iron  deficiency anemia     Pregnancy induced hypertension       Past Surgical History     Past Surgical History:   Procedure Laterality Date    CESAREAN SECTION      x4      Allergies     Allergies   Allergen Reactions    Guaifenesin Nausea Only     Review of Systems   As mentioned in the history of present illness. All remaining 10 point systems were reviewed and remain negative.     Medications     No current outpatient medications on file.     No current facility-administered medications for this visit.       Physical Exam   BP 114/79   Pulse 62   Temp 98.1 F (36.7 C)   Ht 1.676 m (5' 5.98")   Wt 107.6 kg (237 lb 4.8 oz)   LMP 01/19/2024   BMI 38.32 kg/m      This is a 38 y.o. female patient in no acute distress.   She  answers my questions appropriately.  She is awake, alert and oriented.   HEENT: Normal conjunctiva and lids. Oropharynx unremarkable, Neck without cervical or supraclavicular adenopathy or mass.   Pulmonary: Symmetric expansion, no accessory muscle use, no distress  Cardiovascular:  RRR, no peripheral edema  Skin: no rash, no petechiae, no ecchymosis, no pallor  Musculoskeletal: normal muscle  strength  Neurological: no focal deficit, normal gait, alert & oriented x 3     Labs/Imaging:     Lab Results   Component Value Date/Time    WBC 4.0 02/12/2024 10:53 AM    HGB 10.5 02/12/2024 10:53 AM    HCT 33.8 02/12/2024 10:53 AM    PLT 214 02/12/2024 10:53 AM    MCV 88.3 02/12/2024 10:53 AM       Lab Results   Component Value Date/Time    NA 140 11/17/2022 08:27 AM    K 4.4 11/17/2022 08:27 AM    CL 105 11/17/2022 08:27 AM    CO2 28 11/17/2022 08:27 AM    BUN 8 11/17/2022 08:27 AM    GLOB 2.5 11/17/2022 08:27 AM    ALT 13 11/17/2022 08:27 AM     Visit Diagnoses     1. Iron  deficiency anemia, unspecified iron  deficiency anemia type [D50.9]          ASSESSMENT AND PLAN   38 year old female with recurrent iron  deficiency anemia due to menorrhagia.  She has never  responded  to oral iron  but does see benefit after IV iron . She is hopeful the upcoming IUD will lessen the severity of her periods.  We will see her back in three months.    Update iron  studies today  Reviewed high iron  foods  RTC three months.   Ples Brim, APRN - NP    Orders Placed This Encounter    CBC with Auto Differential     Standing Status:   Future     Expected Date:   05/13/2024     Expiration Date:   02/11/2025    Ferritin     Standing Status:   Future     Expected Date:   05/13/2024     Expiration Date:   02/11/2025    Iron  and TIBC     Standing Status:   Future     Expected Date:   05/13/2024     Expiration Date:   02/11/2025

## 2024-02-16 ENCOUNTER — Ambulatory Visit
Admit: 2024-02-16 | Discharge: 2024-02-16 | Payer: PRIVATE HEALTH INSURANCE | Attending: Family | Primary: Family Medicine

## 2024-02-16 VITALS — BP 102/80 | HR 58 | Ht 66.0 in | Wt 235.0 lb

## 2024-02-16 DIAGNOSIS — Z6841 Body Mass Index (BMI) 40.0 and over, adult: Secondary | ICD-10-CM

## 2024-02-16 NOTE — Assessment & Plan Note (Signed)
 Followed by hematologist, current labs reviewed and at goal     All current medications, reviewed, interim health history reviewed and discussed and questions answered.   Precautions were given regarding new or worsening problems, continue any maintenance medications and follow up with PCP.

## 2024-02-16 NOTE — Progress Notes (Signed)
 Kristy Brown (DOB:  20-Dec-1985) is a 38 y.o. female,Established patient, here for evaluation of the following chief complaint(s):  Follow-up (Hasn't been on Zepbound  since last appt, pharmacy needs a new rx)         Assessment & Plan  Class 3 severe obesity without serious comorbidity with body mass index (BMI) of 40.0 to 44.9 in adult, unspecified obesity type (HCC)   Chronic, not at goal (unstable), changes made today: will restart compounded tirzepatide  for her and fax to pharmacy. Follow strict diet, routine exercise as well.          Iron  deficiency anemia due to chronic blood loss     Followed by hematologist, current labs reviewed and at goal     All current medications, reviewed, interim health history reviewed and discussed and questions answered.   Precautions were given regarding new or worsening problems, continue any maintenance medications and follow up with PCP.    The level of risk for this patient is considered moderate, as the visit included prescription drug management.     Return in about 4 weeks (around 03/15/2024), or if symptoms worsen or fail to improve.       Subjective   Patient for weight management and follow up on pmh of obesity IDA.  Would like to restart compounded tirzepatide . Was on prior without side effect.  Following with hematologist for her anemia this is stable on iron  infusions.   No new concerns        Review of Systems   All other systems reviewed and are negative.         Objective   Physical Exam     GENERAL APPEARANCE: well developed, well nourished, in no acute distress.   HEAD: normocephalic, atraumatic.   EYES: Pupils equal and round, conjuctiva clear, lids normal.   EARS: Externally grossly normal. Hearing grossly intact.   NOSE: no external lesions.   NECK: neck supple, normal active ROM exhibited.   LYMPH NODES: no palpable adenopathy.   LUNGS: Breathing comfortably, no audible wheezes on expiration.   CHEST: normal movement  EXTREMITIES: Appears well  perfused.Aaron Aas   NEUROLOGIC: CN grossly intact., cognitive exam grossly normal.   Previous results reviewed and discussed.          An electronic signature was used to authenticate this note.    --Ruperto Coup, APRN - NP

## 2024-02-20 NOTE — Telephone Encounter (Addendum)
 [employer]:  *Mirena/Kyleena/Liletta/Paragard IUD & Insertion/Removal is covered at 100% of the allowable.  No Deductible or Co-Pay.  Prior Authorization is Required. *Benefits are for the Mirena/Kyleena/Liletta/Paragard IUD Device & Insertion/Removal ONLY.  No Device max, based on medical necessity.  Spoke w/ Ewell Holes.  Ref. # [id number]    [employer] Prior Authorization:  No Prior Authorization Required.  Ref. # [id number]

## 2024-02-20 NOTE — Telephone Encounter (Signed)
 Spoke with pt adv ins will cover IUD - no copay

## 2024-02-21 ENCOUNTER — Inpatient Hospital Stay: Admit: 2024-02-21 | Discharge: 2024-02-21 | Payer: PRIVATE HEALTH INSURANCE | Primary: Family Medicine

## 2024-02-21 VITALS — BP 109/75 | HR 65 | Temp 97.90000°F | Resp 16 | Wt 232.9 lb

## 2024-02-21 DIAGNOSIS — D500 Iron deficiency anemia secondary to blood loss (chronic): Secondary | ICD-10-CM

## 2024-02-21 MED ORDER — NORMAL SALINE FLUSH 0.9 % IV SOLN
0.9 % | INTRAVENOUS | Status: DC | PRN
Start: 2024-02-21 — End: 2024-02-22
  Administered 2024-02-21: 13:00:00 20 mL via INTRAVENOUS

## 2024-02-21 MED ORDER — IRON SUCROSE 300 MG IN NS 250 ML IVPB
Freq: Once | Status: AC
Start: 2024-02-21 — End: 2024-02-21
  Administered 2024-02-21: 13:00:00 300 mg via INTRAVENOUS

## 2024-02-21 MED FILL — IRON SUCROSE 20 MG/ML IV SOLN: 20 mg/mL | INTRAVENOUS | Qty: 300 | Fill #0

## 2024-02-27 ENCOUNTER — Encounter: Payer: PRIVATE HEALTH INSURANCE | Primary: Family Medicine

## 2024-02-28 ENCOUNTER — Inpatient Hospital Stay: Admit: 2024-02-28 | Discharge: 2024-02-28 | Payer: PRIVATE HEALTH INSURANCE | Primary: Family Medicine

## 2024-02-28 VITALS — BP 114/66 | HR 74 | Temp 98.10000°F | Resp 18 | Ht 66.0 in | Wt 228.1 lb

## 2024-02-28 DIAGNOSIS — D500 Iron deficiency anemia secondary to blood loss (chronic): Secondary | ICD-10-CM

## 2024-02-28 MED ORDER — IRON SUCROSE 300 MG IN NS 250 ML IVPB
Freq: Once | Status: AC
Start: 2024-02-28 — End: 2024-02-28
  Administered 2024-02-28: 13:00:00 300 mg via INTRAVENOUS

## 2024-02-28 MED ORDER — NORMAL SALINE FLUSH 0.9 % IV SOLN
0.9 | INTRAVENOUS | Status: DC | PRN
Start: 2024-02-28 — End: 2024-02-29
  Administered 2024-02-28: 13:00:00 20 mL via INTRAVENOUS

## 2024-02-28 MED FILL — VENOFER 20 MG/ML IV SOLN: 20 MG/ML | INTRAVENOUS | Qty: 300 | Fill #0

## 2024-03-05 ENCOUNTER — Encounter: Payer: PRIVATE HEALTH INSURANCE | Primary: Family Medicine

## 2024-03-06 ENCOUNTER — Encounter: Payer: PRIVATE HEALTH INSURANCE | Primary: Family Medicine

## 2024-03-07 ENCOUNTER — Inpatient Hospital Stay: Admit: 2024-03-07 | Discharge: 2024-03-07 | Payer: PRIVATE HEALTH INSURANCE | Primary: Family Medicine

## 2024-03-07 VITALS — BP 104/70 | HR 73 | Temp 98.20000°F | Resp 18

## 2024-03-07 DIAGNOSIS — D500 Iron deficiency anemia secondary to blood loss (chronic): Secondary | ICD-10-CM

## 2024-03-07 MED ORDER — IRON SUCROSE 20 MG/ML IV SOLN
20 | Freq: Once | INTRAVENOUS | Status: AC
Start: 2024-03-07 — End: 2024-03-07
  Administered 2024-03-07: 15:00:00 400 mg via INTRAVENOUS

## 2024-03-07 MED ORDER — NORMAL SALINE FLUSH 0.9 % IV SOLN
0.9 | INTRAVENOUS | Status: DC | PRN
Start: 2024-03-07 — End: 2024-03-07
  Administered 2024-03-07: 15:00:00 10 mL via INTRAVENOUS

## 2024-03-07 MED FILL — IRON SUCROSE 20 MG/ML IV SOLN: 20 MG/ML | INTRAVENOUS | Qty: 20 | Fill #0

## 2024-03-08 NOTE — Telephone Encounter (Signed)
 Pt called to cancel her 5/27 IUD insertion appt.  Would like to know if there something available for Wednesday 5/28?

## 2024-03-08 NOTE — Telephone Encounter (Signed)
 Spoke with pt > appt rs to 7/23

## 2024-03-12 ENCOUNTER — Encounter: Payer: PRIVATE HEALTH INSURANCE | Attending: Obstetrics & Gynecology | Primary: Family Medicine

## 2024-04-10 ENCOUNTER — Ambulatory Visit
Admit: 2024-04-10 | Discharge: 2024-04-10 | Payer: PRIVATE HEALTH INSURANCE | Attending: Family | Primary: Family Medicine

## 2024-04-10 VITALS — BP 110/70 | HR 60 | Ht 66.0 in | Wt 228.0 lb

## 2024-04-10 DIAGNOSIS — E66813 Obesity, class 3 (HCC): Secondary | ICD-10-CM

## 2024-04-10 DIAGNOSIS — Z6841 Body Mass Index (BMI) 40.0 and over, adult: Secondary | ICD-10-CM

## 2024-04-10 NOTE — Assessment & Plan Note (Signed)
 Monitored by specialist- no acute findings meriting change in the plan  Continue infusions with dr saylor.

## 2024-04-10 NOTE — Progress Notes (Addendum)
 Kristy Brown (DOB:  01-18-1986) is a 38 y.o. female,Established patient, here for evaluation of the following chief complaint(s):  Follow-up         Assessment & Plan  Class 3 severe obesity without serious comorbidity with body mass index (BMI) of 40.0 to 44.9 in adult, unspecified obesity type (HCC)     Will follow response to increase to compounded glp1 and send to [facility]. Precautions given for side effects to therapy. Continue strict low carb diet routine exercise and resistance training in the meantime.        Iron  deficiency anemia due to chronic blood loss   Monitored by specialist- no acute findings meriting change in the plan  Continue infusions with dr saylor.        Encounter for weight management            Prediabetes          All current medications, reviewed, interim health history reviewed and discussed and questions answered.   Precautions were given regarding new or worsening problems, continue any maintenance medications and follow up with PCP.    The level of risk for this patient is considered moderate, as the visit included prescription drug management.     Return in about 3 months (around 07/11/2024), or if symptoms worsen or fail to improve.       Subjective   04/10/24  Please see below . For follow up.. restarted compounded tirzepatide  and tolerating this without side effect. Only down a few lbs from last visit and would like to increase dosing. No nausea, vomiting, no blood or tarry stools.   Continues care with dr artemus for her iron  infusion.   No new concerns today    02/16/24  Patient for weight management and follow up on pmh of obesity IDA.  Would like to restart compounded tirzepatide . Was on prior without side effect.  Following with hematologist for her anemia this is stable on iron  infusions.   No new concerns          Review of Systems   All other systems reviewed and are negative.         Objective   Physical Exam     GENERAL APPEARANCE: well developed, well nourished,  in no acute distress.   HEAD: normocephalic, atraumatic.   EYES: Pupils equal and round, conjuctiva clear, lids normal.   EARS: Externally grossly normal. Hearing grossly intact.   NOSE: no external lesions.   NECK: neck supple, normal active ROM exhibited.   LYMPH NODES: no palpable adenopathy.   LUNGS: Breathing comfortably, no audible wheezes on expiration.   CHEST: normal movement  EXTREMITIES: Appears well perfused.[name]   NEUROLOGIC: CN grossly intact., cognitive exam grossly normal.   Previous results reviewed and discussed.      An electronic signature was used to authenticate this note.    --Jolan DELENA Pleasant, APRN - NP

## 2024-05-08 ENCOUNTER — Ambulatory Visit
Admit: 2024-05-08 | Discharge: 2024-05-08 | Payer: PRIVATE HEALTH INSURANCE | Attending: Obstetrics & Gynecology | Primary: Family Medicine

## 2024-05-08 DIAGNOSIS — Z30430 Encounter for insertion of intrauterine contraceptive device: Principal | ICD-10-CM

## 2024-05-08 MED ORDER — LEVONORGESTREL 20 MCG/DAY IU IUD
20 | Freq: Once | INTRAUTERINE | Status: AC
Start: 2024-05-08 — End: 2024-05-08
  Administered 2024-05-08: 15:00:00 1 via INTRAUTERINE

## 2024-05-08 NOTE — Progress Notes (Signed)
 Subjective:  Chief Complaints:       1.  Mirena  IUD Insertion.    HPI:         Kristy Brown is a 38 y.o. female who came to today for IUD insertion.  She was previously counseled concerning risks, benefits,side effects and alternatives. Risks include infection, excess bleeding, perforation of uterus and internal organs, rejection.  All questions were answered.  She denies an active vaginal infection and pregnancy.    Current Outpatient Medications   Medication Sig Dispense Refill    NONFORMULARY Tirzepatide  17 mg/mL Glycine 10 mg/mL Injection Solution (1 mL - White Cap)   Add as: unknown  Inject 50 units (0.5 ml=8.5 mg) subcutaneously once weekly. Do not increase unless directed by prescriber.          Source: External PharmacyUpdated on: 02/16/2024       No current facility-administered medications for this visit.         Objective:  Vitals:    05/08/24 1033   BP: 113/79      Physical Examination:  GENERAL:     General: alert and oriented x 3, well developed and well nourished.  HEART: regular rate and rhythm, normal S1S2, no murmurs.  LUNGS:  clear to auscultation bilaterally, no wheezes, rhonchi/rales.  GENITOURINARY - FEMALE:      EXTERNAL GENITALS: normal, no lesions.  Vagina:  pink/moist mucosa, without any lesions,. Cervix: downward, normal appearing, : cervical movement tenderness.    Assessment:  The encounter diagnosis was Encounter for IUD insertion.       Procedures:  IUD Insertion:  Consent informed consent obtained, Pregnancy test negative, 30 second time out taken.  Prep: the cervix was prepped with betadine.  Procedure: a single-tooth tenaculum was placed, the uterus was sounded and found to be 7 cm, the mirena  was inserted without difficulty, the strings were cut to 2cm in length.  Post procedure: the patient tolerated procedure well, instructed to take NSAIDS as directed for cramping, informed her that cramping and spotting are to be expected.    No results found for any visits on 05/08/24.      No orders of the defined types were placed in this encounter.      Follow Up:  No follow-up provider specified.

## 2024-05-13 ENCOUNTER — Ambulatory Visit: Payer: PRIVATE HEALTH INSURANCE | Primary: Family Medicine

## 2024-05-13 ENCOUNTER — Ambulatory Visit: Payer: PRIVATE HEALTH INSURANCE | Attending: Hematology & Oncology | Primary: Family Medicine

## 2024-05-27 ENCOUNTER — Ambulatory Visit
Admit: 2024-05-27 | Discharge: 2024-05-27 | Payer: PRIVATE HEALTH INSURANCE | Attending: Hematology & Oncology | Primary: Family Medicine

## 2024-05-27 ENCOUNTER — Other Ambulatory Visit: Admit: 2024-05-27 | Discharge: 2024-05-27 | Payer: PRIVATE HEALTH INSURANCE | Primary: Family Medicine

## 2024-05-27 VITALS — BP 115/80 | HR 71 | Temp 97.60000°F | Ht 66.0 in | Wt 237.8 lb

## 2024-05-27 DIAGNOSIS — D500 Iron deficiency anemia secondary to blood loss (chronic): Principal | ICD-10-CM

## 2024-05-27 DIAGNOSIS — D509 Iron deficiency anemia, unspecified: Principal | ICD-10-CM

## 2024-05-27 LAB — CBC WITH AUTO DIFFERENTIAL
Basophils %: 0.4 % (ref 0.0–2.0)
Basophils Absolute: 0 x10e3/mcL (ref 0.0–0.2)
Eosinophils %: 5.2 % (ref 0.0–7.0)
Eosinophils Absolute: 0.2 x10e3/mcL (ref 0.0–0.5)
Hematocrit: 39.1 % (ref 34.0–47.0)
Hemoglobin: 12.4 g/dL (ref 11.5–15.7)
Lymphocytes Absolute: 1.7 x10e3/mcL (ref 1.0–3.2)
Lymphocytes: 36.3 % (ref 15.0–45.0)
MCH: 29.9 pg (ref 27.0–34.5)
MCHC: 31.7 g/dL (ref 30.0–36.0)
MCV: 94.2 fL (ref 81.0–99.0)
MPV: 10.9 fL (ref 7.0–12.2)
Monocytes %: 9 % (ref 4.0–12.0)
Monocytes Absolute: 0.4 x10e3/mcL (ref 0.3–1.0)
Neutrophils %: 49.1 % (ref 42.0–74.0)
Neutrophils Absolute: 2.3 x10e3/mcL (ref 1.6–7.3)
Platelets: 253 x10e3/mcL (ref 140–440)
RBC: 4.15 x10e3/mcL (ref 3.60–5.20)
RDW: 14.1 % (ref 10.0–17.0)
WBC: 4.7 x10e3/mcL (ref 3.8–10.6)

## 2024-05-27 LAB — IRON AND TIBC
Iron % Saturation: 23 % (ref 20–40)
Iron: 70 ug/dL (ref 37–145)
TIBC: 310 ug/dL (ref 250–450)
UIBC: 240 ug/dL (ref 112.0–347.0)

## 2024-05-27 LAB — FERRITIN: Ferritin: 55.1 ng/mL (ref 13.0–150.0)

## 2024-05-27 NOTE — Progress Notes (Signed)
 Progress Note -- Hematology/ Medical Oncology    Patient Name: Kristy Brown Attending: Recardo Ferdinand, MD   DOB: Apr 10, 1986  Age:38 y.o. ERE:[id number], Amy C, DO    Date of Visit: 05/27/2024        Chief Complaint     Chief Complaint   Patient presents with    Follow-up   Iron  deficiency anemia     Hematologic/Oncologic History   Oral iron    IV ferric carboxymaltose 750mg  x 2 doses 06/2021  IV Venofer  12/2023    History of Present Illness   Kristy Brown returns today for follow up for a history of iron  deficiency anemia thought to be due to heavy menstrual cycles.  She had an IUD placed 3 weeks ago. He period was much less heavy. She feels well today and her Hgb is excellent.     Past Medical History     Past Medical History:   Diagnosis Date    Iron  deficiency anemia     Pregnancy induced hypertension       Past Surgical History     Past Surgical History:   Procedure Laterality Date    CESAREAN SECTION      x4    TUBAL LIGATION        Allergies     Allergies   Allergen Reactions    Guaifenesin Nausea Only     Review of Systems   As mentioned in the history of present illness. All remaining 10 point systems were reviewed and remain negative.     Medications     Current Outpatient Medications   Medication Sig Dispense Refill    NONFORMULARY Tirzepatide  17 mg/mL Glycine 10 mg/mL Injection Solution (1 mL - White Cap)   Add as: unknown  Inject 50 units (0.5 ml=8.5 mg) subcutaneously once weekly. Do not increase unless directed by prescriber.          Source: External PharmacyUpdated on: 02/16/2024       No current facility-administered medications for this visit.       Physical Exam   BP 115/80   Pulse 71   Temp 97.6 F (36.4 C)   Ht 1.676 m (5' 6)   Wt 107.9 kg (237 lb 12.8 oz)   LMP 04/27/2024   BMI 38.38 kg/m      This is a 38 y.o. female patient in no acute distress.   She  answers my questions appropriately.  She is awake, alert and oriented.   HEENT: Normal conjunctiva and lids. Oropharynx  unremarkable, Neck without cervical or supraclavicular adenopathy or mass.   Pulmonary: Symmetric expansion, no accessory muscle use, no distress  Cardiovascular:  RRR, no peripheral edema  Skin: no rash, no petechiae, no ecchymosis, no pallor  Musculoskeletal: normal muscle strength  Neurological: no focal deficit, normal gait, alert & oriented x 3     Labs/Imaging:     Lab Results   Component Value Date/Time    WBC 4.0 02/12/2024 10:53 AM    HGB 10.5 02/12/2024 10:53 AM    HCT 33.8 02/12/2024 10:53 AM    PLT 214 02/12/2024 10:53 AM    MCV 88.3 02/12/2024 10:53 AM       Lab Results   Component Value Date/Time    NA 140 11/17/2022 08:27 AM    K 4.4 11/17/2022 08:27 AM    CL 105 11/17/2022 08:27 AM    CO2 28 11/17/2022 08:27 AM    BUN 8 11/17/2022 08:27 AM    GLOB  2.5 11/17/2022 08:27 AM    ALT 13 11/17/2022 08:27 AM     Visit Diagnoses     1. Iron  deficiency anemia due to chronic blood loss [D50.0]          ASSESSMENT AND PLAN   38 year old female with recurrent iron  deficiency anemia due to menorrhagia.  Hopefully with the IUD her iron  needs will be less due to decreased menstrual flow.     RTC in 6 months    Recardo Deronda Ferdinand, MD    No orders of the defined types were placed in this encounter.

## 2024-06-13 NOTE — Telephone Encounter (Signed)
 Patient had to cancel her 09/03 OV with Dr. Wetzel for a string check due to a work conflict. Patient did reschedule for 07/09/24 at the [facility] location with Dr. Wetzel for 8:20 am.

## 2024-06-13 NOTE — Telephone Encounter (Signed)
 Fwd to [place] - please advise

## 2024-06-19 ENCOUNTER — Encounter: Attending: Obstetrics & Gynecology | Primary: Family Medicine

## 2024-07-09 ENCOUNTER — Ambulatory Visit
Admit: 2024-07-09 | Discharge: 2024-07-09 | Payer: PRIVATE HEALTH INSURANCE | Attending: Obstetrics & Gynecology | Primary: Family Medicine

## 2024-07-09 VITALS — BP 117/79 | Ht 65.98 in | Wt 244.0 lb

## 2024-07-09 DIAGNOSIS — N939 Abnormal uterine and vaginal bleeding, unspecified: Principal | ICD-10-CM

## 2024-07-09 NOTE — Progress Notes (Signed)
 Kristy Brown (DOB:  [birth year]) is a 38 y.o. female,Established patient, here for evaluation of the following chief complaint(s):  Follow-up (String ck )      Assessment & Plan   ASSESSMENT/PLAN:  1. 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  Now looking for a job  No follow-ups on file.         Subjective   SUBJECTIVE/OBJECTIVE:  HPI  38 year old gravida 4 para 4 presents for string check for IUD placement particularly the Mirena  secondary to abnormal uterine bleeding.  Her bleeding did stop and improved a week ago.  She has had intercourse with the IUD in and was comfortable no complaints today  Allergies   Allergen Reactions    Guaifenesin Nausea Only     Current Outpatient Medications   Medication Sig Dispense Refill    NONFORMULARY Tirzepatide  17 mg/mL Glycine 10 mg/mL Injection Solution (1 mL - White Cap)   Add as: unknown  Inject 50 units (0.5 ml=8.5 mg) subcutaneously once weekly. Do not increase unless directed by prescriber.          Source: External PharmacyUpdated on: 02/16/2024 (Patient not taking: Reported on 07/09/2024)       No current facility-administered medications for this visit.       Past Medical History:   Diagnosis Date    Iron  deficiency anemia     Pregnancy induced hypertension      Past Surgical History:   Procedure Laterality Date    CESAREAN SECTION      x4    TUBAL LIGATION       Social History     Socioeconomic History    Marital status: Married     Spouse name: Not on file    Number of children: Not on file    Years of education: Not on file    Highest education level: Not on file   Occupational History    Not on file   Tobacco Use    Smoking status: Never    Smokeless tobacco: Never   Vaping Use    Vaping status: Never Used   Substance and Sexual Activity    Alcohol use: Not Currently    Drug use: Never    Sexual activity: Not on file   Other Topics Concern    Not on file   Social  History Narrative    Married    Lives with 4 kids, husband, parents, brother    Occupation Facilities manager     Social Drivers of Health     Financial Resource Strain: Low Risk  (07/03/2019)    Received from [facility]    Overall Financial Resource Strain (CARDIA)     Difficulty of Paying Living Expenses: Not hard at all   Food Insecurity: No Food Insecurity (07/03/2019)    Received from [facility]    Hunger Vital Sign     Within the past 12 months, you worried that your food would run out before you got the money to buy more.: Never true     Within the past 12 months, the food you bought just didn't last and you didn't have money to get more.: Never true   Transportation Needs: No Transportation Needs (07/03/2019)    Received from [facility]     Lack of Transportation (Medical): No     Lack of Transportation (Non-Medical): No  Physical Activity: Not on file   Stress: Not on file   Social Connections: Not on file   Intimate Partner Violence: Not on file   Housing Stability: Not on file     Family History   Problem Relation Age of Onset    Hypertension Mother     Hypertension Father     Other Sister         autoimmune disease    Autism Brother     Diabetes Paternal Actor     Other Daughter         neurological issues; mitochondral 4 deficiency    Breast Cancer Neg Hx     Prostate Cancer Neg Hx     Colon Cancer Neg Hx        Review of Systems     Review of Systems - General ROS: negative  Breast ROS: negative for breast lumps  Respiratory ROS: no cough, shortness of breath, or wheezing  Cardiovascular ROS: no chest pain or dyspnea on exertion  Gastrointestinal ROS: no abdominal pain, change in bowel habits, or black or bloody stools  Genito-Urinary ROS: no dysuria, trouble voiding, or hematuria  Musculoskeletal ROS: negative    Objective   Blood pressure 117/79, height 1.676 m (5' 5.98), weight 110.7 kg (244 lb).    Physical Exam     Physical Examination: General appearance - alert,  well appearing, and in no distress  Abdomen - soft, nontender, nondistended, no masses or organomegaly  Pelvic - normal external genitalia, vulva, vagina, cervix, uterus and adnexa      An electronic signature was used to authenticate this note.    --Mitchell JULIANNA Anger, MD

## 2024-07-17 ENCOUNTER — Encounter: Attending: Family | Primary: Family Medicine

## 2024-08-09 ENCOUNTER — Ambulatory Visit
Admit: 2024-08-09 | Discharge: 2024-08-09 | Payer: PRIVATE HEALTH INSURANCE | Attending: Family | Primary: Family Medicine

## 2024-08-09 VITALS — BP 122/74 | HR 71 | Ht 66.0 in | Wt 248.0 lb

## 2024-08-09 DIAGNOSIS — Z6841 Body Mass Index (BMI) 40.0 and over, adult: Principal | ICD-10-CM

## 2024-08-09 MED ORDER — ZEPBOUND 2.5 MG/0.5ML SC SOLN
2.5 | SUBCUTANEOUS | 1 refills | 28.00000 days | Status: AC
Start: 2024-08-09 — End: 2024-09-08

## 2024-08-09 NOTE — Assessment & Plan Note (Signed)
" {  A/P Summary:[id number]}         "

## 2024-08-09 NOTE — Progress Notes (Signed)
"      Kristy Brown (DOB:  April 14, 1986) is a 38 y.o. female,Established patient, here for evaluation of the following chief complaint(s):  Follow-up (3 months)         Assessment & Plan  Class 3 severe obesity without serious comorbidity with body mass index (BMI) of 40.0 to 44.9 in adult, unspecified obesity type (HCC)            Iron  deficiency anemia due to chronic blood loss            Menorrhagia with regular cycle            Encounter for weight management            Morbid obesity (HCC)     Will follow response to restarting glp1 along with strict diet, routine exercise and resistance training. Can consider phentermine if too expensive.   Orders:    tirzepatide -weight management (ZEPBOUND ) 2.5 MG/0.5ML SOLN subCUTAneous injection (VIAL); Inject 0.5 mLs into the skin once a week    All current medications, reviewed, interim health history reviewed and discussed and questions answered.   Precautions were given regarding new or worsening problems, continue any maintenance medications and follow up with PCP.    The level of risk for this patient is considered moderate, as the visit included prescription drug management. During this visit, 3 items were reviewed and/or ordered.        Return in 3 months (on 11/09/2024), or if symptoms worsen or fail to improve.       Subjective   Patient for follow up. Stopped compounded glp1 as they got too expensive. Would like to restart as her weight has gone up. Was tolerating zepbound  prior without issue.   No other concerns today, chronic conditions are stable. Following with dr kattie for [place].         Review of Systems   All other systems reviewed and are negative.         Objective   Physical Exam   GENERAL APPEARANCE: well developed, well nourished, in no acute distress.   HEAD: normocephalic, atraumatic.   EYES: Pupils equal and round, conjuctiva clear, lids normal.   EARS: Externally grossly normal. Hearing grossly intact.   NOSE: no external lesions.   NECK: neck  supple, normal active ROM exhibited.   LYMPH NODES: no palpable adenopathy.   LUNGS: Breathing comfortably, no audible wheezes on expiration.   CHEST: normal movement  EXTREMITIES: Appears well perfused.[name]   NEUROLOGIC: CN grossly intact., cognitive exam grossly normal.   Previous results reviewed and discussed.            An electronic signature was used to authenticate this note.    --Jolan DELENA Pleasant, APRN - NP   "

## 2024-11-13 ENCOUNTER — Encounter: Payer: PRIVATE HEALTH INSURANCE | Attending: Family Medicine | Primary: Family Medicine

## 2024-11-25 ENCOUNTER — Ambulatory Visit: Payer: PRIVATE HEALTH INSURANCE | Attending: Hematology & Oncology | Primary: Family Medicine

## 2024-11-25 ENCOUNTER — Ambulatory Visit: Payer: PRIVATE HEALTH INSURANCE | Primary: Family Medicine

## 2024-11-28 ENCOUNTER — Encounter: Payer: PRIVATE HEALTH INSURANCE | Attending: Family Medicine | Primary: Family Medicine
